# Patient Record
Sex: Female | Born: 1955 | Race: White | Hispanic: No | Marital: Married
Health system: Southern US, Community
[De-identification: ages and names within clinical notes are randomized; demographics above are authoritative.]

## PROBLEM LIST (undated history)

## (undated) DIAGNOSIS — M130 Polyarthritis, unspecified: Secondary | ICD-10-CM

---

## 2015-10-14 ENCOUNTER — Other Ambulatory Visit: Payer: Self-pay | Admitting: Endocrinology

## 2015-10-14 DIAGNOSIS — E041 Nontoxic single thyroid nodule: Secondary | ICD-10-CM

## 2015-10-27 ENCOUNTER — Ambulatory Visit
Admission: RE | Admit: 2015-10-27 | Discharge: 2015-10-27 | Disposition: A | Discharge status: Home / Self Care | Payer: BC Managed Care – PPO | Source: Ambulatory Visit | Attending: Endocrinology | Admitting: Endocrinology

## 2015-10-27 ENCOUNTER — Other Ambulatory Visit (HOSPITAL_COMMUNITY)
Admission: RE | Admit: 2015-10-27 | Discharge: 2015-10-27 | Disposition: A | Discharge status: Home / Self Care | Payer: BC Managed Care – PPO | Source: Ambulatory Visit | Attending: Interventional Radiology | Admitting: Interventional Radiology

## 2015-10-27 DIAGNOSIS — E041 Nontoxic single thyroid nodule: Secondary | ICD-10-CM

## 2015-10-27 DIAGNOSIS — E042 Nontoxic multinodular goiter: Secondary | ICD-10-CM | POA: Insufficient documentation

## 2015-11-12 ENCOUNTER — Ambulatory Visit
Admission: RE | Admit: 2015-11-12 | Discharge: 2015-11-12 | Disposition: A | Discharge status: Home / Self Care | Payer: BC Managed Care – PPO | Source: Ambulatory Visit | Attending: Interventional Radiology | Admitting: Interventional Radiology

## 2015-11-12 ENCOUNTER — Other Ambulatory Visit: Payer: Self-pay | Admitting: Endocrinology

## 2015-11-12 ENCOUNTER — Other Ambulatory Visit (HOSPITAL_COMMUNITY): Payer: Self-pay | Admitting: Interventional Radiology

## 2015-11-12 ENCOUNTER — Ambulatory Visit
Admission: RE | Admit: 2015-11-12 | Discharge: 2015-11-12 | Disposition: A | Discharge status: Home / Self Care | Payer: BC Managed Care – PPO | Source: Ambulatory Visit | Attending: Endocrinology | Admitting: Endocrinology

## 2015-11-12 DIAGNOSIS — R221 Localized swelling, mass and lump, neck: Secondary | ICD-10-CM

## 2015-11-12 HISTORY — PX: IR GENERIC HISTORICAL: IMG1180011

## 2015-11-12 NOTE — Progress Notes (Signed)
Patient ID: Tami Barnes, female   DOB: 24-May-1956, 60 y.o.   MRN: [id number]    Chief Complaint: Patient was seen in consultation today for  Chief Complaint  Patient presents with  . Follow-up    2.5 wk follow up US Thyroid Biopsy   at the request of Tami Barnes  Referring Physician(s): Tami Barnes  Supervising Physician: Tami Barnes  History of Present Illness: Tami Barnes is a 60 y.o. female who had a left thyroid biopsy 2 weeks ago and recently experienced swelling in the left side of her neck. She returns for evaluation of the swelling. She denies any difficulty breathing but does have slight pressure within her neck. She denies difficulty swallowing. The biopsy results were benign.  No past medical history on file.  No past surgical history on file.  Allergies: Review of patient's allergies indicates no known allergies.  Medications: Prior to Admission medications   Not on File     No family history on file.  Social History   Social History  . Marital Status: Married    Spouse Name: N/A  . Number of Children: N/A  . Years of Education: N/A   Social History Main Topics  . Smoking status: Not on file  . Smokeless tobacco: Not on file  . Alcohol Use: Not on file  . Drug Use: Not on file  . Sexual Activity: Not on file   Other Topics Concern  . Not on file   Social History Narrative  . No narrative on file     Review of Systems: A 12 point ROS discussed and pertinent positives are indicated in the HPI above.  All other systems are negative.  Review of Systems  Vital Signs: BP 128/78 mmHg  Pulse 85  Temp(Src) 98.2 F (36.8 C) (Oral)  Resp 14  SpO2 97%  Physical Exam  Constitutional: She is oriented to person, place, and time. She appears well-developed and well-nourished.  Neck:  Examination of the neck demonstrates a mass in the region of the left thyroid lobe. There is no swelling within the soft tissues surrounding the left thyroid gland. No  obvious bruising or hematoma. Auscultation of the neck demonstrates no evidence of inspiratory stridor.  Neurological: She is alert and oriented to person, place, and time.  Skin: Skin is warm and dry.    Mallampati Score:     Imaging: US Thyroid Biopsy  10/27/2015  INDICATION: 60 year old female who has a history of 2 left-sided thyroid nodules. She has been referred for biopsy. EXAM: ULTRASOUND GUIDED NEEDLE ASPIRATE BIOPSY OF THE THYROID GLAND COMPARISON:  09/11/2015 MEDICATIONS: 1% lidocaine COMPLICATIONS: None PROCEDURE: Informed written consent was obtained from the patient after a thorough discussion of the procedural risks, benefits and alternatives. All questions were addressed. Maximal Sterile Barrier Technique was utilized including caps, mask, sterile gowns, sterile gloves, sterile drape, hand hygiene and skin antiseptic. A timeout was performed prior to the initiation of the procedure. The procedure, risks, benefits, and alternatives were explained to the patient. Questions regarding the procedure were encouraged and answered. The patient understands and consents to the procedure. Ultrasound survey was performed with images stored and sent to PACs. The left neck was prepped with Betadine in a sterile fashion, and a sterile drape was applied covering the operative field. A sterile gown and sterile gloves were used for the procedure. Local anesthesia was provided with 1% Lidocaine. Left inferior: Ultrasound guidance was used to infiltrate the region with 1% lidocaine for local anesthesia. Three separate 25  gauge fine needle biopsy were then acquired of the left inferior nodule using ultrasound guidance. Images were stored. Left mid: Three separate 25 gauge fine needle biopsy were then acquired of the left mid nodule using ultrasound guidance. Images were stored. Finally, a 20 gauge needle was used to aspirate approximately 5 cc of fluid from this partially cystic nodule. Slide preparation was  performed. Final image was stored after biopsy. Patient tolerated the procedure well and remained hemodynamically stable throughout. No complications were encountered and no significant blood loss was encounter IMPRESSION: Status post ultrasound-guided biopsy of left mid thyroid nodule and left inferior thyroid nodule. Tissue specimen sent to pathology for complete histopathologic analysis. Signed, Tami Neu. Loreta Ave, DO [facility] Radiology Electronically Signed   By: Gilmer Mor D.O.   On: 10/27/2015 17:20   US Thyroid Biopsy  10/27/2015  INDICATION: 60 year old female who has a history of 2 left-sided thyroid nodules. She has been referred for biopsy. EXAM: ULTRASOUND GUIDED NEEDLE ASPIRATE BIOPSY OF THE THYROID GLAND COMPARISON:  09/11/2015 MEDICATIONS: 1% lidocaine COMPLICATIONS: None PROCEDURE: Informed written consent was obtained from the patient after a thorough discussion of the procedural risks, benefits and alternatives. All questions were addressed. Maximal Sterile Barrier Technique was utilized including caps, mask, sterile gowns, sterile gloves, sterile drape, hand hygiene and skin antiseptic. A timeout was performed prior to the initiation of the procedure. The procedure, risks, benefits, and alternatives were explained to the patient. Questions regarding the procedure were encouraged and answered. The patient understands and consents to the procedure. Ultrasound survey was performed with images stored and sent to PACs. The left neck was prepped with Betadine in a sterile fashion, and a sterile drape was applied covering the operative field. A sterile gown and sterile gloves were used for the procedure. Local anesthesia was provided with 1% Lidocaine. Left inferior: Ultrasound guidance was used to infiltrate the region with 1% lidocaine for local anesthesia. Three separate 25 gauge fine needle biopsy were then acquired of the left inferior nodule  using ultrasound guidance. Images were stored. Left mid: Three separate 25 gauge fine needle biopsy were then acquired of the left mid nodule using ultrasound guidance. Images were stored. Finally, a 20 gauge needle was used to aspirate approximately 5 cc of fluid from this partially cystic nodule. Slide preparation was performed. Final image was stored after biopsy. Patient tolerated the procedure well and remained hemodynamically stable throughout. No complications were encountered and no significant blood loss was encounter IMPRESSION: Status post ultrasound-guided biopsy of left mid thyroid nodule and left inferior thyroid nodule. Tissue specimen sent to pathology for complete histopathologic analysis. Signed, Tami Neu. Loreta Ave, DO [facility] Radiology Electronically Signed   By: Gilmer Mor D.O.   On: 10/27/2015 17:20    Labs:  CBC: No results for input(s): WBC, HGB, HCT, PLT in the last 8760 hours.  COAGS: No results for input(s): INR, APTT in the last 8760 hours.  BMP: No results for input(s): NA, K, CL, CO2, GLUCOSE, BUN, CALCIUM, CREATININE, GFRNONAA, GFRAA in the last 8760 hours.  Invalid input(s): CMP  LIVER FUNCTION TESTS: No results for input(s): BILITOT, AST, ALT, ALKPHOS, PROT, ALBUMIN in the last 8760 hours.  TUMOR MARKERS: No results for input(s): AFPTM, CEA, CA199, CHROMGRNA in the last 8760 hours.  Assessment and Plan:  Tami Barnes did experience some neck swelling after her left thyroid biopsy. There is no evidence of soft tissue hematoma within the neck. The  dominant nodule in the left lobe has slightly enlarged from 25 mm to 31 mm likely due to a small amount of internal hemorrhage. She was instructed to apply ice 3 times daily and hopefully her symptoms will improve. If her symptoms do not improve, surgical consultation can be performed and was explained to the patient.  Thank you for this interesting consult.  I  greatly enjoyed meeting Tami Barnes and look forward to participating in their care.  A copy of this report was sent to the requesting provider on this date.  Electronically Signed: Feven Alderfer, ART A 11/12/2015, 3:39 PM   I spent a total of   10 Minutes in face to face in clinical consultation, greater than 50% of which was counseling/coordinating care for thyroid biopsy and neck swelling.

## 2016-08-19 ENCOUNTER — Encounter: Payer: Self-pay | Admitting: Interventional Radiology

## 2017-05-29 IMAGING — US US THYROID BIOPSY
1 series · 13 of 20 positions shown · non-contrast
Comparison: 09/11/2015

INDICATION: 59-year-old female who has a history of 2 left-sided thyroid
nodules. She has been referred for biopsy.

EXAM:
ULTRASOUND GUIDED NEEDLE ASPIRATE BIOPSY OF THE THYROID GLAND

[Series 1: us thyroid biopsy · 0.08mm/px · 20 acquisitions, 13 frames shown]
[im 1/20]
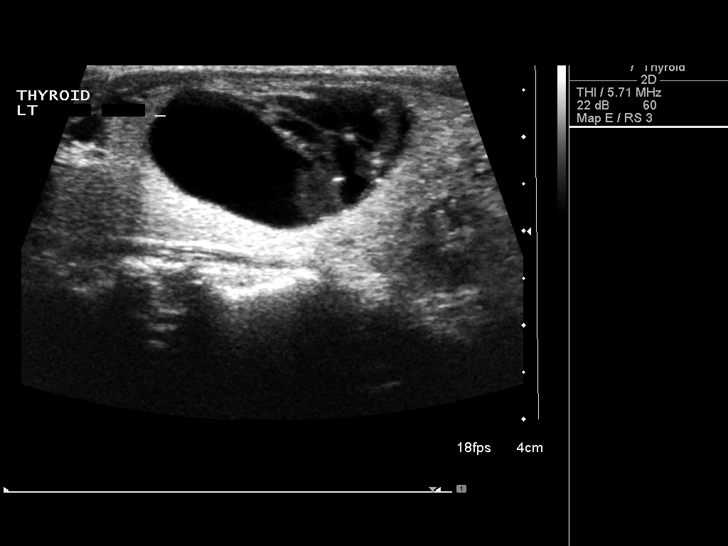
[im 3/20]
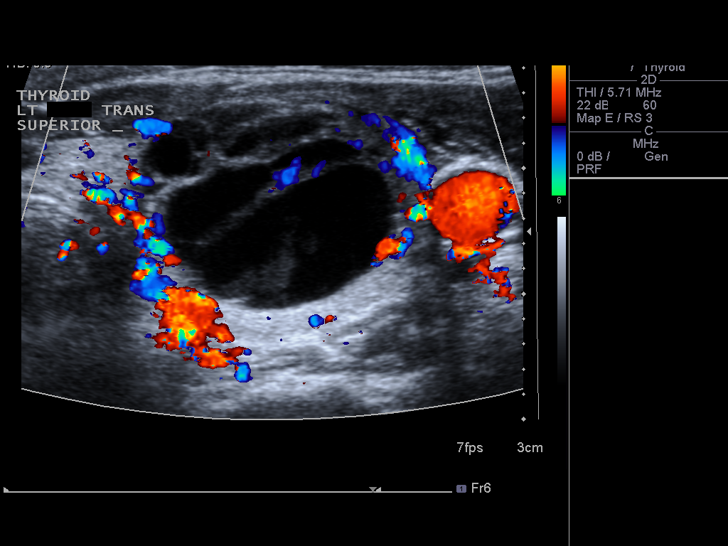
[im 4/20]
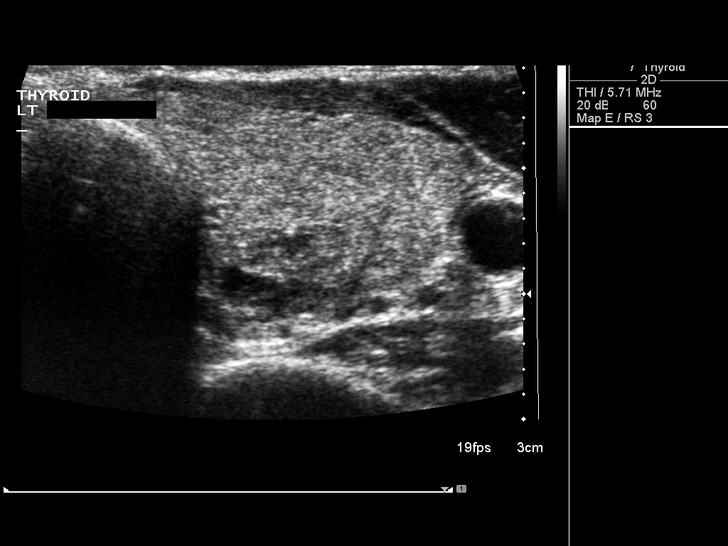
[im 6/20]
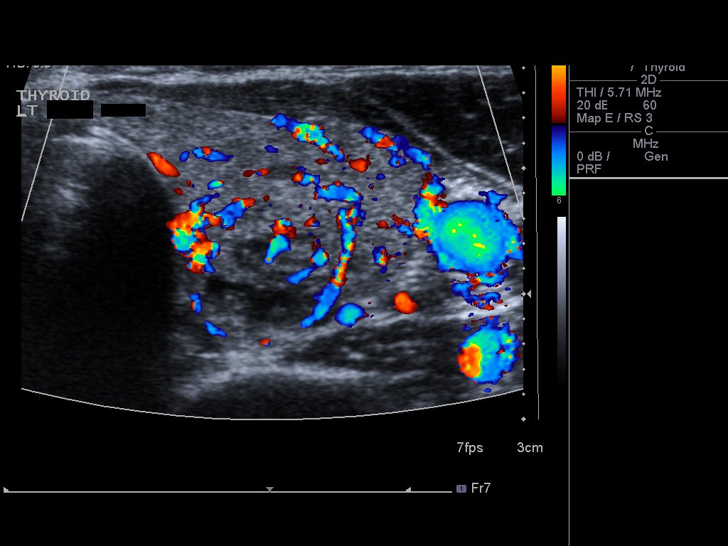
[im 7/20]
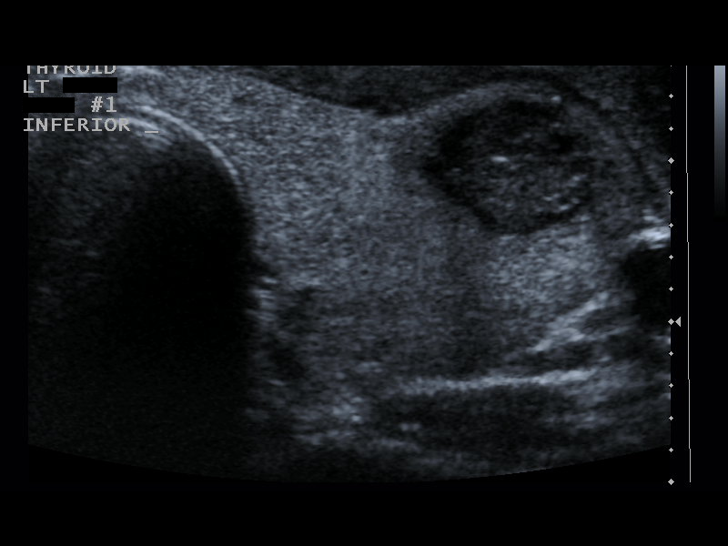
[im 9/20]
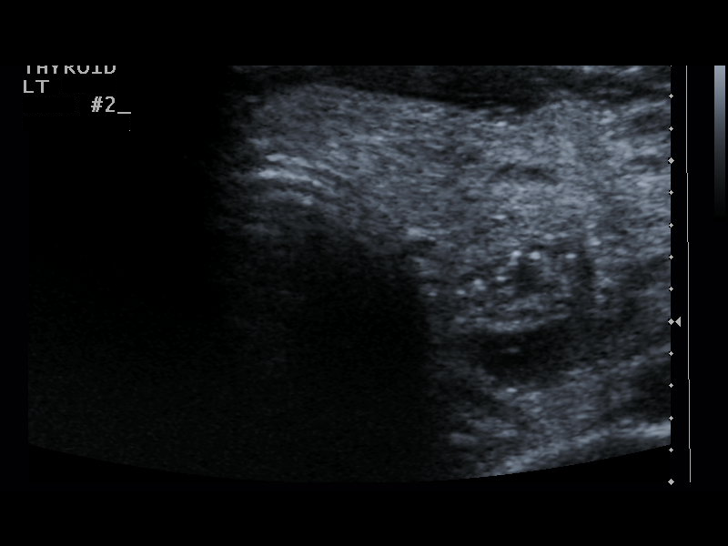
[im 11/20]
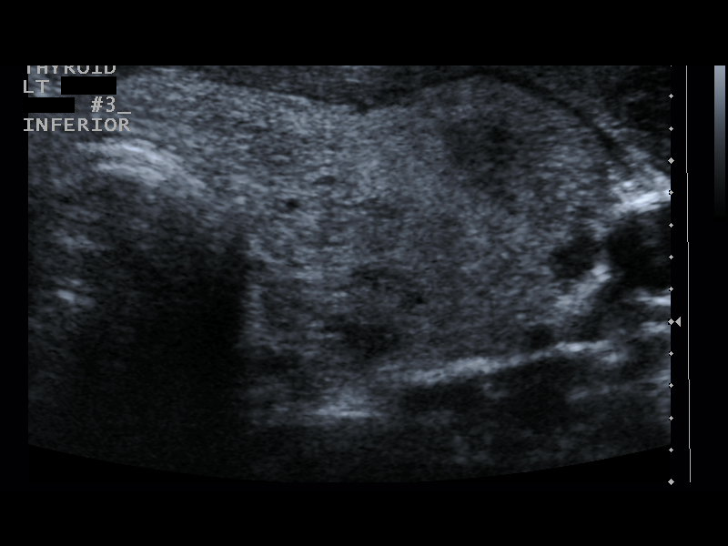
[im 12/20]
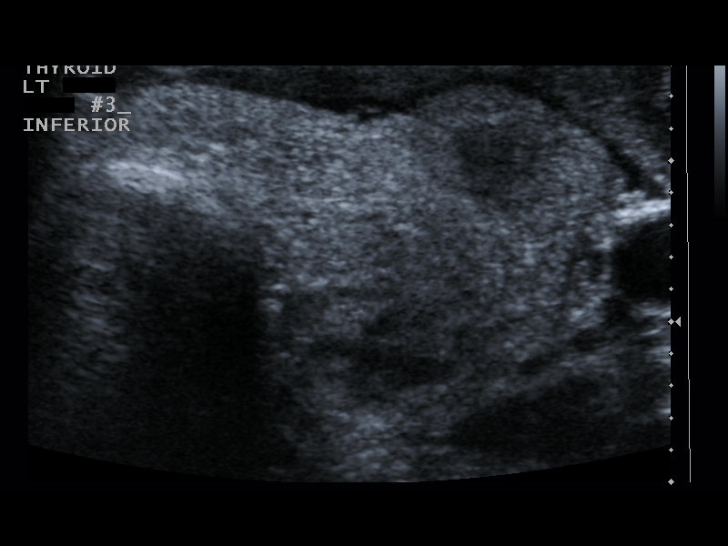
[im 14/20]
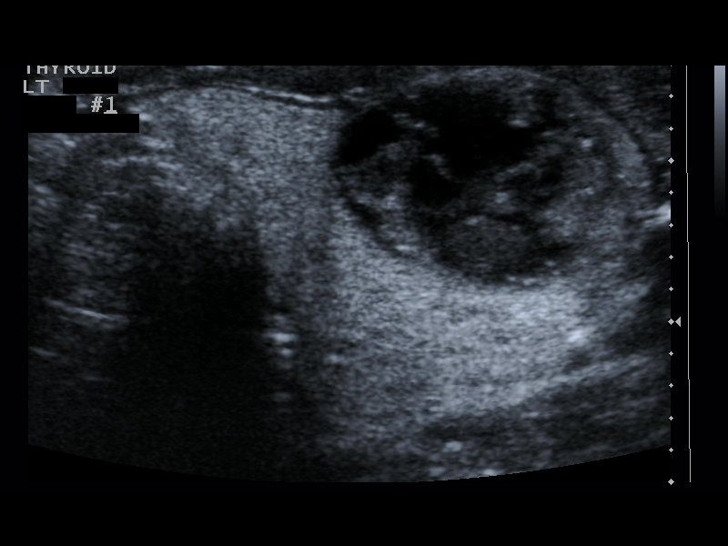
[im 15/20]
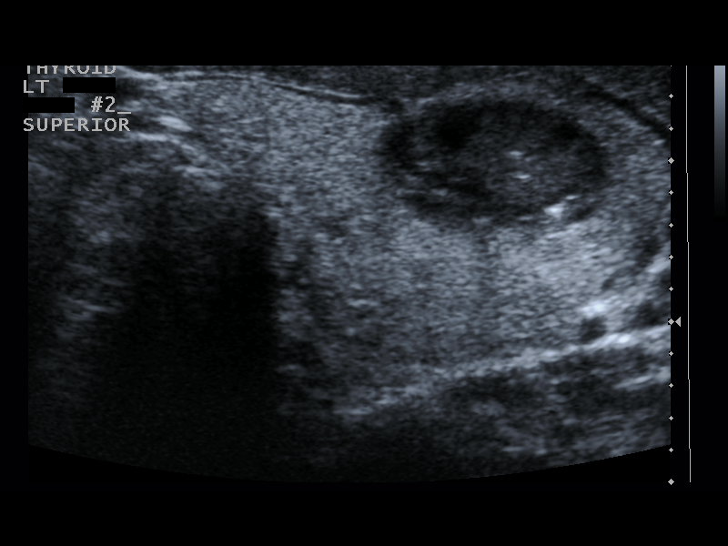
[im 17/20]
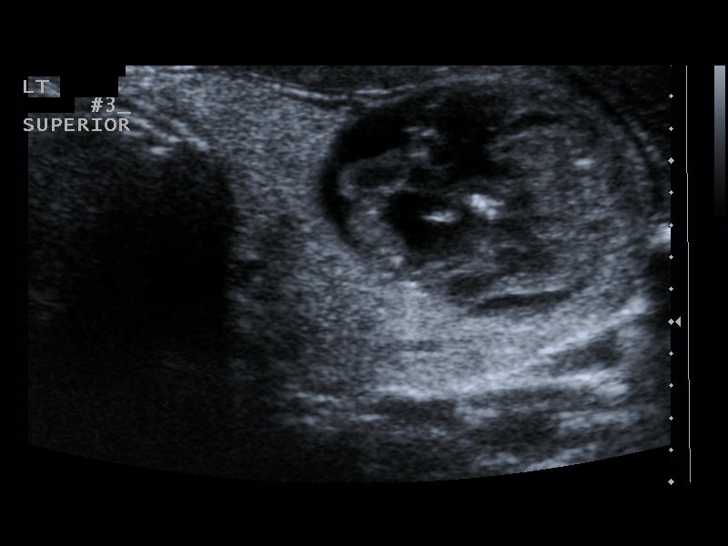
[im 18/20]
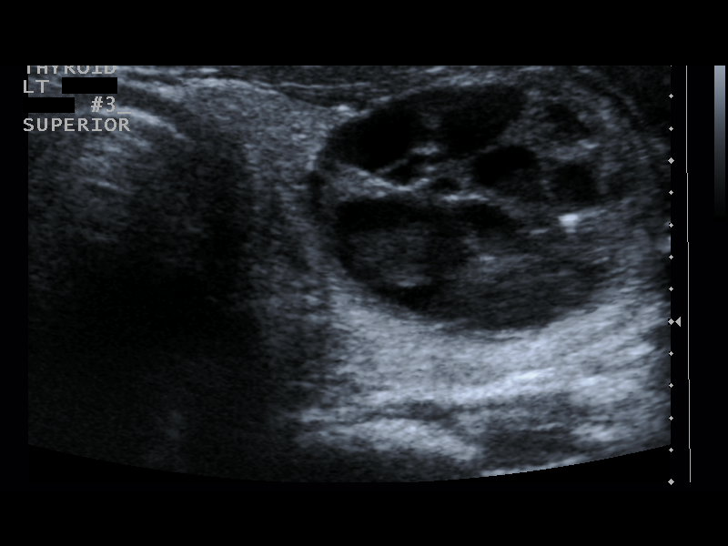
[im 20/20]
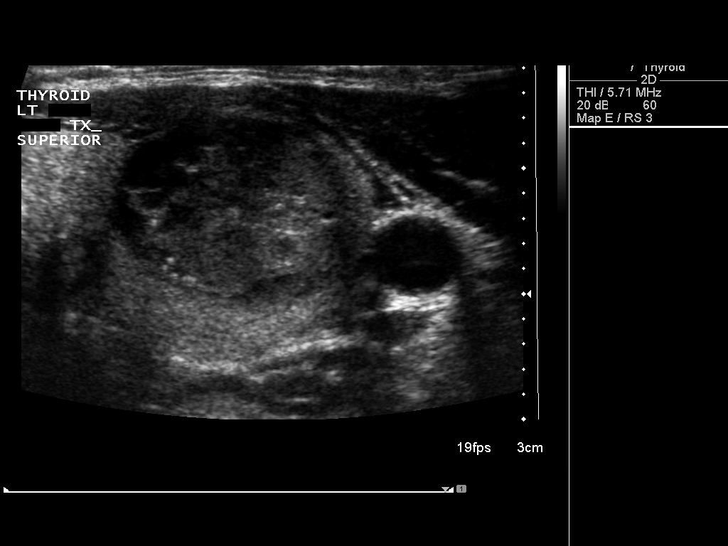

[13 of 20 positions shown; findings below may reference images not displayed]

MEDICATIONS:
1% lidocaine

COMPLICATIONS:
None

PROCEDURE:
Informed written consent was obtained from the patient after a
thorough discussion of the procedural risks, benefits and
alternatives. All questions were addressed. Maximal Sterile Barrier
Technique was utilized including caps, mask, sterile gowns, sterile
gloves, sterile drape, hand hygiene and skin antiseptic. A timeout
was performed prior to the initiation of the procedure.

The procedure, risks, benefits, and alternatives were explained to
the patient. Questions regarding the procedure were encouraged and
answered. The patient understands and consents to the procedure.

Ultrasound survey was performed with images stored and sent to PACs.

The left neck was prepped with Betadine in a sterile fashion, and a
sterile drape was applied covering the operative field. A sterile
gown and sterile gloves were used for the procedure. Local
anesthesia was provided with 1% Lidocaine.

Left inferior:

Ultrasound guidance was used to infiltrate the region with 1%
lidocaine for local anesthesia. Three separate 25 gauge fine needle
biopsy were then acquired of the left inferior nodule using
ultrasound guidance. Images were stored.

Left mid:

Three separate 25 gauge fine needle biopsy were then acquired of the
left mid nodule using ultrasound guidance. Images were stored.
Finally, a 20 gauge needle was used to aspirate approximately 5 cc
of fluid from this partially cystic nodule.

Slide preparation was performed.

Final image was stored after biopsy.

Patient tolerated the procedure well and remained hemodynamically
stable throughout.

No complications were encountered and no significant blood loss was
encounter
IMPRESSION: Status post ultrasound-guided biopsy of left mid thyroid nodule and
left inferior thyroid nodule. Tissue specimen sent to pathology for
complete histopathologic analysis.

## 2017-06-14 IMAGING — US US SOFT TISSUE HEAD/NECK
1 series · 14 of 14 positions shown · non-contrast
Comparison: None.

CLINICAL DATA: Neck swelling after left thyroid biopsy

EXAM:
ULTRASOUND OF HEAD/NECK SOFT TISSUES
TECHNIQUE: Ultrasound examination of the head and neck soft tissues was
performed in the area of clinical concern.

[Series 1: us soft tissue head/neck · 0.06mm/px · 14 of 14 slices shown]
[im 1/14]
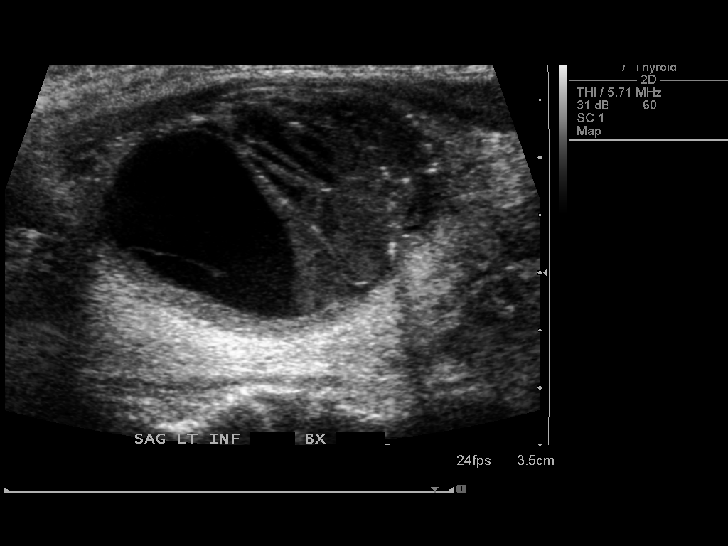
[im 2/14]
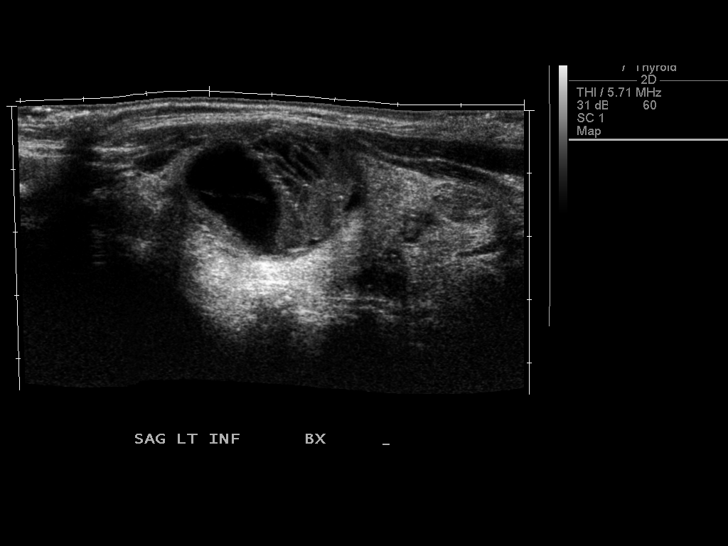
[im 3/14]
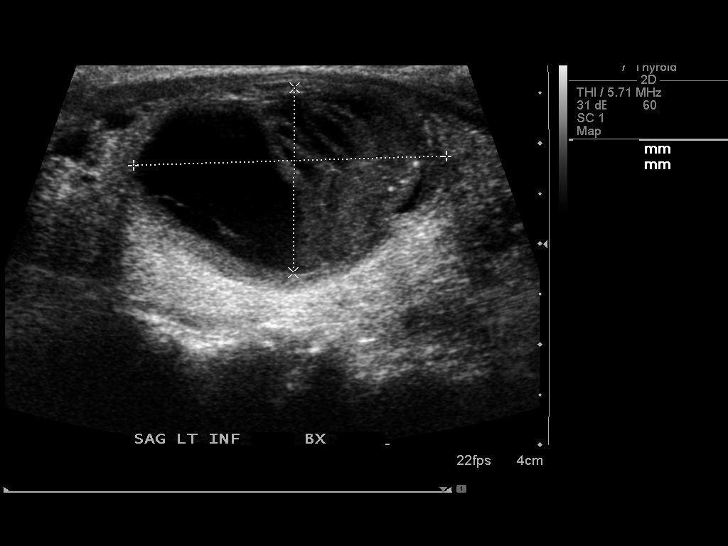
[im 4/14]
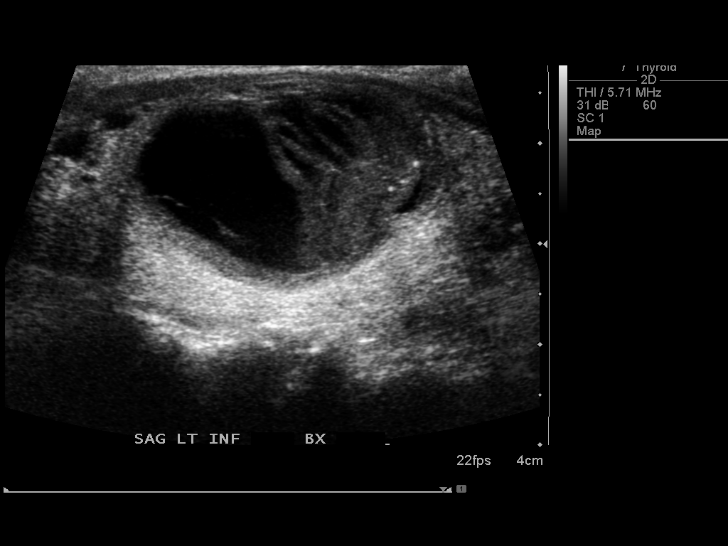
[im 5/14]
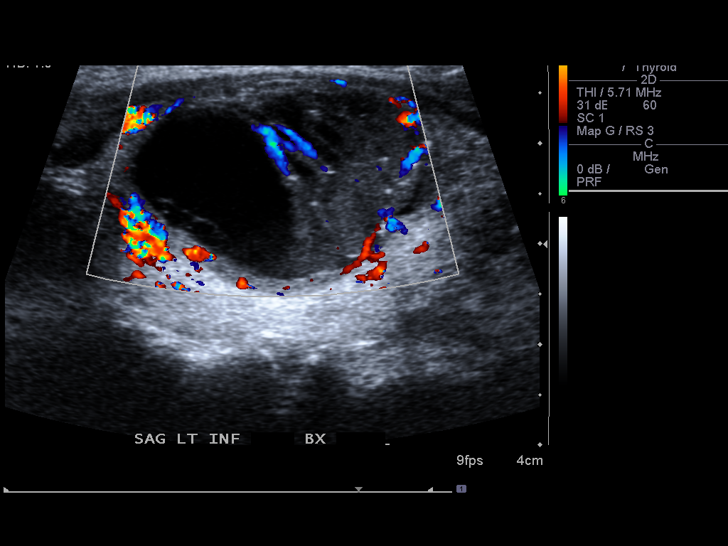
[im 6/14]
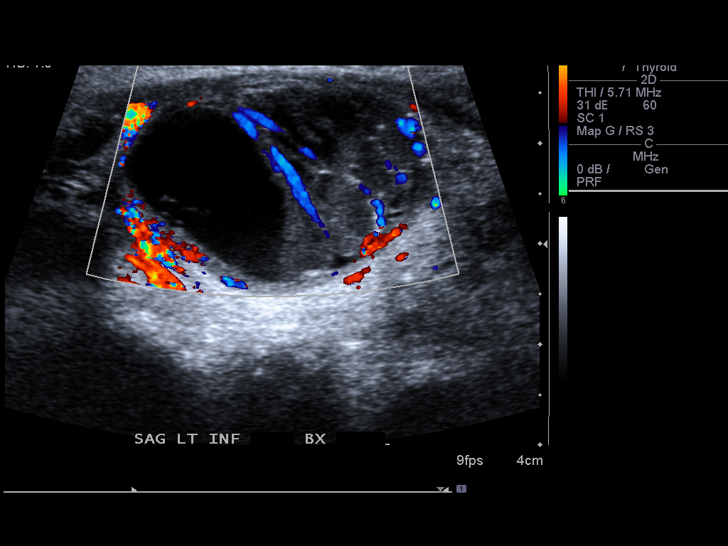
[im 7/14]
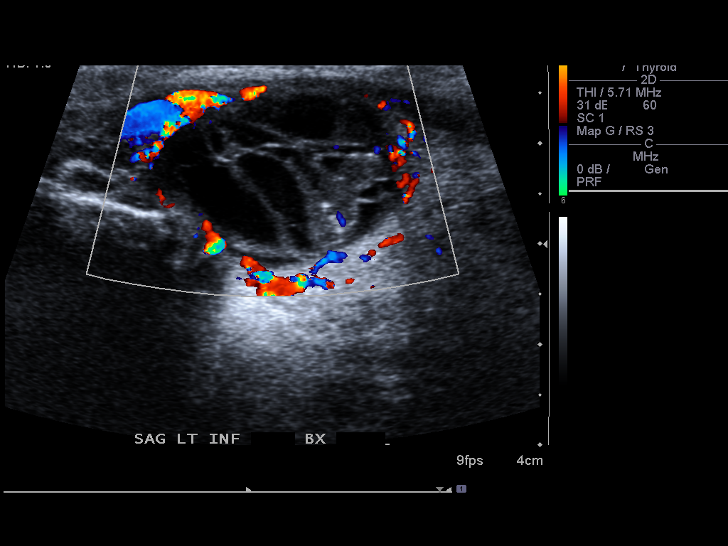
[im 8/14]
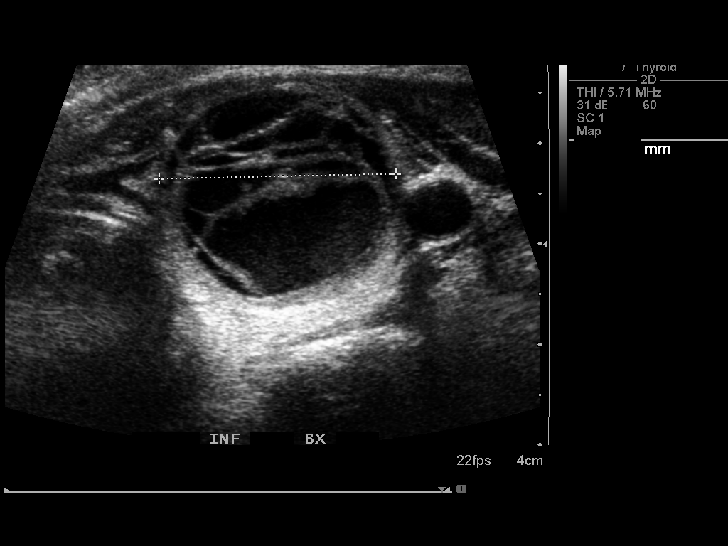
[im 9/14]
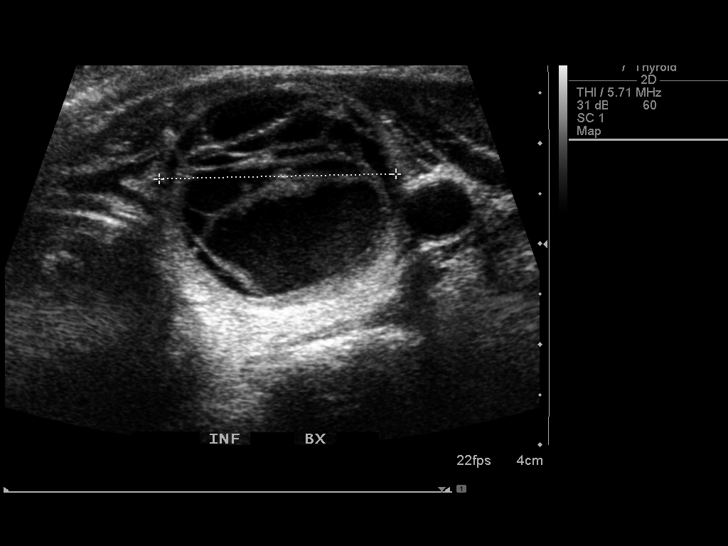
[im 10/14]
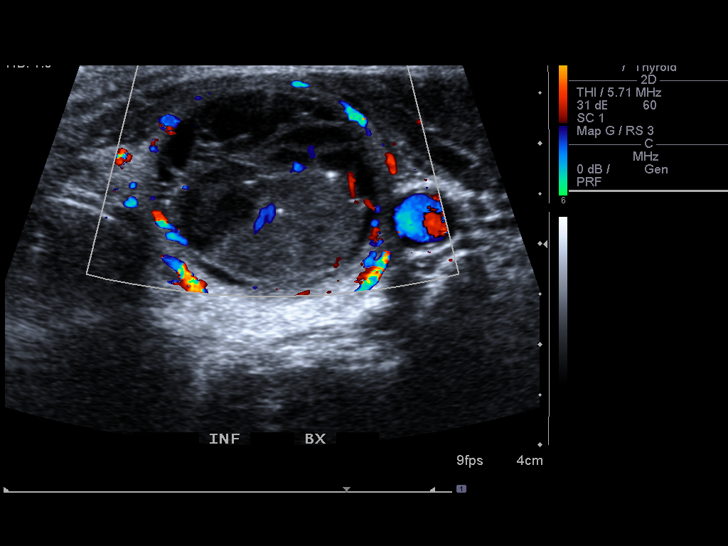
[im 11/14]
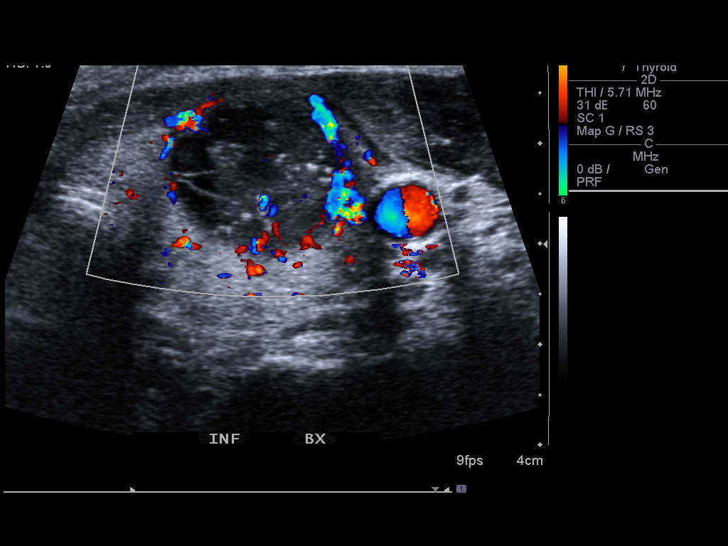
[im 12/14]
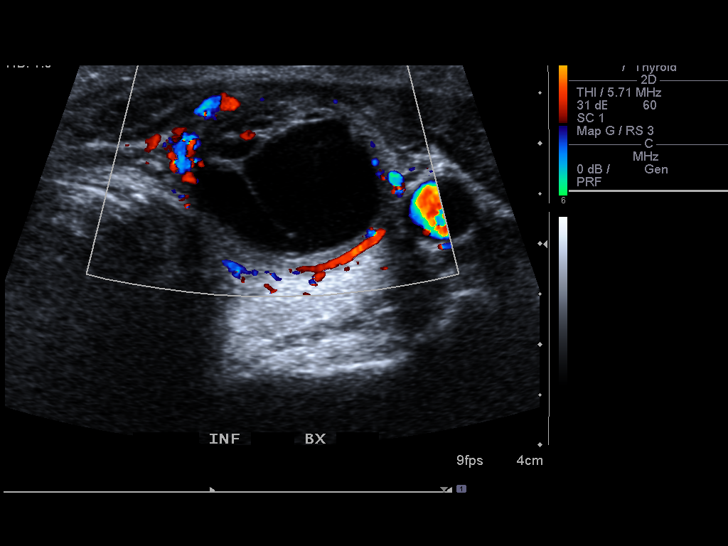
[im 13/14]
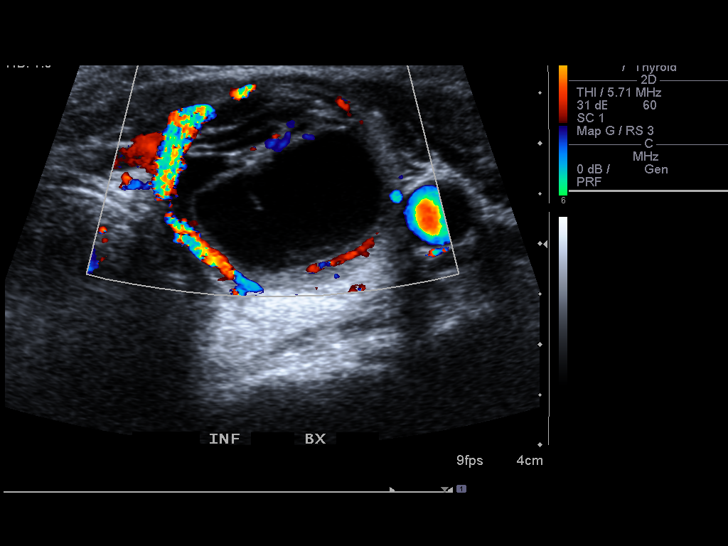
[im 14/14]
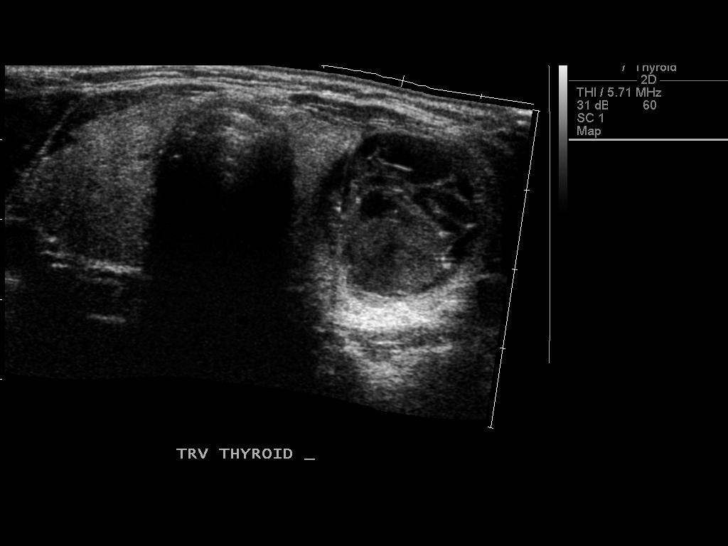

[14 of 14 positions shown; findings below may reference images not displayed]

FINDINGS: Limited evaluation of the thyroid gland and left side of the neck
demonstrates no soft tissue hematoma for expansion of the underlying
musculature. The complex left thyroid nodule previously measured up
to 2.5 cm by today measures up to 3.1 cm. Therefore, there has been
some enlargement of this nodule likely secondary to a small amount
of internal hemorrhage.
IMPRESSION: There is no evidence of soft tissue hemorrhage within the neck soft
tissues. The nodule in the left lobe which was previously biopsy has
slightly enlarged likely due to a small amount of internal
hemorrhage.

## 2019-01-14 ENCOUNTER — Encounter (HOSPITAL_COMMUNITY): Payer: Self-pay | Admitting: *Deleted

## 2019-01-14 ENCOUNTER — Other Ambulatory Visit: Payer: Self-pay

## 2019-01-14 ENCOUNTER — Observation Stay (HOSPITAL_COMMUNITY): Payer: BC Managed Care – PPO

## 2019-01-14 ENCOUNTER — Observation Stay (HOSPITAL_COMMUNITY)
Admission: EM | Admit: 2019-01-14 | Discharge: 2019-01-15 | Disposition: A | Discharge status: Home / Self Care | Payer: BC Managed Care – PPO | Attending: Family Medicine | Admitting: Family Medicine

## 2019-01-14 DIAGNOSIS — R05 Cough: Secondary | ICD-10-CM | POA: Diagnosis not present

## 2019-01-14 DIAGNOSIS — G43909 Migraine, unspecified, not intractable, without status migrainosus: Secondary | ICD-10-CM | POA: Insufficient documentation

## 2019-01-14 DIAGNOSIS — M069 Rheumatoid arthritis, unspecified: Secondary | ICD-10-CM | POA: Diagnosis not present

## 2019-01-14 DIAGNOSIS — Z79899 Other long term (current) drug therapy: Secondary | ICD-10-CM | POA: Insufficient documentation

## 2019-01-14 DIAGNOSIS — Z1159 Encounter for screening for other viral diseases: Secondary | ICD-10-CM | POA: Diagnosis not present

## 2019-01-14 DIAGNOSIS — J158 Pneumonia due to other specified bacteria: Secondary | ICD-10-CM | POA: Insufficient documentation

## 2019-01-14 DIAGNOSIS — A310 Pulmonary mycobacterial infection: Principal | ICD-10-CM

## 2019-01-14 DIAGNOSIS — M2550 Pain in unspecified joint: Secondary | ICD-10-CM

## 2019-01-14 DIAGNOSIS — Z87891 Personal history of nicotine dependence: Secondary | ICD-10-CM | POA: Diagnosis not present

## 2019-01-14 DIAGNOSIS — K219 Gastro-esophageal reflux disease without esophagitis: Secondary | ICD-10-CM

## 2019-01-14 DIAGNOSIS — R059 Cough, unspecified: Secondary | ICD-10-CM

## 2019-01-14 DIAGNOSIS — I10 Essential (primary) hypertension: Secondary | ICD-10-CM

## 2019-01-14 HISTORY — DX: Polyarthritis, unspecified: M13.0

## 2019-01-14 LAB — COMPREHENSIVE METABOLIC PANEL
ALT: 12 U/L (ref 0–44)
AST: 12 U/L — ABNORMAL LOW (ref 15–41)
Albumin: 2.7 g/dL — ABNORMAL LOW (ref 3.5–5.0)
Alkaline Phosphatase: 143 U/L — ABNORMAL HIGH (ref 38–126)
Anion gap: 10 (ref 5–15)
BUN: 11 mg/dL (ref 8–23)
CO2: 25 mmol/L (ref 22–32)
Calcium: 9.1 mg/dL (ref 8.9–10.3)
Chloride: 101 mmol/L (ref 98–111)
Creatinine, Ser: 0.46 mg/dL (ref 0.44–1.00)
GFR calc Af Amer: >60 mL/min (ref >60)
GFR calc non Af Amer: >60 mL/min (ref >60)
Glucose, Bld: 103 mg/dL — ABNORMAL HIGH (ref 70–99)
Potassium: 3.9 mmol/L (ref 3.5–5.1)
Sodium: 136 mmol/L (ref 135–145)
Total Bilirubin: 0.8 mg/dL (ref 0.3–1.2)
Total Protein: 6.7 g/dL (ref 6.5–8.1)

## 2019-01-14 LAB — SARS CORONAVIRUS 2 BY RT PCR (HOSPITAL ORDER, PERFORMED IN ~~LOC~~ HOSPITAL LAB): SARS Coronavirus 2: NEGATIVE

## 2019-01-14 LAB — CBC WITH DIFFERENTIAL/PLATELET
Abs Immature Granulocytes: 0.04 10*3/uL (ref 0.00–0.07)
Basophils Absolute: 0 10*3/uL (ref 0.0–0.1)
Basophils Relative: 0 %
Eosinophils Absolute: 0.1 10*3/uL (ref 0.0–0.5)
Eosinophils Relative: 1 %
HCT: 34.5 % — ABNORMAL LOW (ref 36.0–46.0)
Hemoglobin: 10.7 g/dL — ABNORMAL LOW (ref 12.0–15.0)
Immature Granulocytes: 0 %
Lymphocytes Relative: 9 %
Lymphs Abs: 0.9 10*3/uL (ref 0.7–4.0)
MCH: 28.8 pg (ref 26.0–34.0)
MCHC: 31 g/dL (ref 30.0–36.0)
MCV: 92.7 fL (ref 80.0–100.0)
Monocytes Absolute: 0.7 10*3/uL (ref 0.1–1.0)
Monocytes Relative: 8 %
Neutro Abs: 7.7 10*3/uL (ref 1.7–7.7)
Neutrophils Relative %: 82 %
Platelets: 423 10*3/uL — ABNORMAL HIGH (ref 150–400)
RBC: 3.72 MIL/uL — ABNORMAL LOW (ref 3.87–5.11)
RDW: 12.3 % (ref 11.5–15.5)
WBC: 9.5 10*3/uL (ref 4.0–10.5)
nRBC: 0 % (ref 0.0–0.2)

## 2019-01-14 MED ORDER — PANTOPRAZOLE SODIUM 40 MG PO TBEC
40.0000 mg | DELAYED_RELEASE_TABLET | Freq: Every day | ORAL | Status: DC
  Administered 2019-01-15: 40 mg via ORAL
  Filled 2019-01-14 (×2): qty 1

## 2019-01-14 MED ORDER — ETHAMBUTOL HCL 400 MG PO TABS
400.0000 mg | ORAL_TABLET | Freq: Every day | ORAL | Status: DC
Start: 2019-01-14 — End: 2019-01-15
  Administered 2019-01-14 – 2019-01-15 (×2): 400 mg via ORAL
  Filled 2019-01-14 (×2): qty 1

## 2019-01-14 MED ORDER — KETOROLAC TROMETHAMINE 15 MG/ML IJ SOLN
15.0000 mg | Freq: Once | INTRAMUSCULAR | Status: AC
  Administered 2019-01-14: 15 mg via INTRAVENOUS
  Filled 2019-01-14: qty 1

## 2019-01-14 MED ORDER — ACETAMINOPHEN 325 MG PO TABS: 650.0000 mg | ORAL_TABLET | Freq: Four times a day (QID) | ORAL | Status: DC | PRN

## 2019-01-14 MED ORDER — ACETAMINOPHEN 650 MG RE SUPP: 650.0000 mg | Freq: Four times a day (QID) | RECTAL | Status: DC | PRN

## 2019-01-14 MED ORDER — METOPROLOL SUCCINATE ER 50 MG PO TB24
50.0000 mg | ORAL_TABLET | Freq: Every day | ORAL | Status: DC
  Administered 2019-01-15: 50 mg via ORAL
  Filled 2019-01-14 (×2): qty 1

## 2019-01-14 MED ORDER — VITAMIN D 25 MCG (1000 UNIT) PO TABS
1000.0000 [IU] | ORAL_TABLET | Freq: Every day | ORAL | Status: DC
  Administered 2019-01-15: 1000 [IU] via ORAL
  Filled 2019-01-14: qty 1

## 2019-01-14 MED ORDER — AZITHROMYCIN 500 MG PO TABS
500.0000 mg | ORAL_TABLET | Freq: Every day | ORAL | Status: DC
  Administered 2019-01-14 – 2019-01-15 (×2): 500 mg via ORAL
  Filled 2019-01-14 (×2): qty 1

## 2019-01-14 MED ORDER — KETOROLAC TROMETHAMINE 10 MG PO TABS
10.0000 mg | ORAL_TABLET | Freq: Four times a day (QID) | ORAL | Status: DC | PRN
  Administered 2019-01-14 – 2019-01-15 (×2): 10 mg via ORAL
  Filled 2019-01-14 (×3): qty 1

## 2019-01-14 MED ORDER — RIFAMPIN 300 MG PO CAPS
300.0000 mg | ORAL_CAPSULE | Freq: Two times a day (BID) | ORAL | Status: DC
  Administered 2019-01-14 – 2019-01-15 (×2): 300 mg via ORAL
  Filled 2019-01-14 (×3): qty 1

## 2019-01-14 MED ORDER — ENOXAPARIN SODIUM 40 MG/0.4ML ~~LOC~~ SOLN
40.0000 mg | SUBCUTANEOUS | Status: DC
  Administered 2019-01-14: 40 mg via SUBCUTANEOUS
  Filled 2019-01-14: qty 0.4

## 2019-01-14 MED ORDER — KETOROLAC TROMETHAMINE 15 MG/ML IJ SOLN: 15.0000 mg | Freq: Four times a day (QID) | INTRAMUSCULAR | Status: DC | PRN

## 2019-01-14 MED ORDER — RAMELTEON 8 MG PO TABS
8.0000 mg | ORAL_TABLET | Freq: Every day | ORAL | Status: DC
  Administered 2019-01-14: 8 mg via ORAL
  Filled 2019-01-14 (×2): qty 1

## 2019-01-14 MED ORDER — RISAQUAD PO CAPS
ORAL_CAPSULE | Freq: Every day | ORAL | Status: DC
  Administered 2019-01-15: 1 via ORAL
  Filled 2019-01-14 (×2): qty 1

## 2019-01-14 MED ORDER — ONDANSETRON HCL 4 MG PO TABS
4.0000 mg | ORAL_TABLET | Freq: Four times a day (QID) | ORAL | Status: DC | PRN
  Administered 2019-01-14 – 2019-01-15 (×3): 4 mg via ORAL
  Filled 2019-01-14 (×3): qty 1

## 2019-01-14 MED ORDER — DICLOFENAC SODIUM 1 % TD GEL
2.0000 g | Freq: Four times a day (QID) | TRANSDERMAL | Status: DC
  Administered 2019-01-14 – 2019-01-15 (×3): 2 g via TOPICAL
  Filled 2019-01-14: qty 100

## 2019-01-14 NOTE — ED Notes (Signed)
ED Provider at bedside. 

## 2019-01-14 NOTE — ED Notes (Signed)
Pt. Is communicating with friends and family with her cell phone.

## 2019-01-14 NOTE — ED Triage Notes (Signed)
Pt in c/o continued joint pain and swelling, was admitted in [facility] for several weeks, was recommended that pt come to [facility] to see infectious disease and receive a PICC for IV antibiotics, no distress noted

## 2019-01-14 NOTE — H&P (Addendum)
[facility] Admission History and Physical Service Pager: [phone]  Patient name: Tami Barnes Medical record number: [id number] Date of birth: [birth year] Age: 63 y.o. Gender: female  Primary Care Provider: Loraine Leriche., MD Consultants: ID Code Status: Full Preferred Emergency Contact: Son, Tami Barnes, [place]  Chief Complaint: Known MAC pneumonia  Assessment and Plan: Tami Barnes is a 63 y.o. female presenting with polyarticular joint pain and MAC pneumonia. PMH is significant for known MAC pneumonia, HTN, joint pain with positive RF and ANCA, migraines, GERD.  MAC Pneumonia Followed as outpatient by Dr. Sheral Flow. Patient had hospitalization in May at [facility] for pneumonia, during which she received a bronchoscopy and was diagnosed with MAC.  At that time, ID had been involved and had recommended a PICC line, but patient had declined.  She was discharged from the hospital on p.o. antibiotics and has been taking azithromycin, ethambutol, rifampin.  She was discharged from [facility] regional 10 days ago.  Prior to having a PICC line inserted, patient had noted that she wanted to have a CVTS consult.  She has an appointment scheduled for 6/15, but has not yet been seen by them.  Per ID note in care everywhere, both patient's ID physician and pulmonologist do not believe that she would be a good candidate for surgery given her extensive infectious disease.  Patient has been having worsening polyarticular joint pain in the last week, and is now I am able to PICC line placement for antibiotics.  She presented to ED with request to be admitted to see ID at [facility] and have PICC line placed, as her joints have gotten to the point that she is having difficulty functioning at home.  Spoke with ID physician on-call, Dr. Linus Salmons, who notes that he would not like to initiate IV antibiotics at this time, as he is not sure that patient needs them.  He would  like to do his own work-up for this.  Will admit patient to facilitate work-up and consult as needed. -Admit to observation, Dr. Cindra Presume service -Vitals per floor protocol -Chest x-ray -ID consulted, appreciate recommendations, plan to see 6/2 -Continue PO azithromycin, ethambutol, rifampin pending ID recs -Spoke with infection prevention who agreed that patient does not need to be on precautions -Check HIV as not seen in care everywhere -Consider CVTS consult in a.m. pending ID recs -Regular diet -Lovenox for DVT Ppx  Joint Pain with positive RF and ANCA Patient has been having worsening polyarticular joint pain and swelling, mostly in hands, in the last 10 days.  She has had a positive rheumatoid factor and ANCA, and was referred to rheumatology by her ID physician.  Patient continues to state "I cannot take care of myself like this."  She has been taking Tylenol 1000 mg and ibuprofen 600 mg 3 times daily for pain without improvement.  On exam, patient does have swelling noted of fingers, no tenderness to palpation.  She also states "some of the doctors that I am seeing think that this has something to do with my lung infection."  Would not start prednisone given patient's active infection with MAC. - toradol 23m q6h prn - tylenol [id number] prn - voltaren gel QID to affected areas - f/i ID recs - f/u rheum outpatient  HTN Patient has hypertension noted on past medical history on chart review.  No antihypertensives at present.  BP on admission 112/86. -Continue to monitor BP  Migraines Patient has a history of migraines and  takes Maxalt 10 mg as needed.  Denies any headache at present. -Can give Maxalt if needed for headache  GERD On Prilosec 40 mg daily at home. -Continue Protonix as is formulary  FEN/GI: Regular Prophylaxis: Lovenox  Disposition: admit to observation, med-surg  History of Present Illness:  Tami Barnes is a 63 y.o. female presenting with MAC pneumonia and  request for ID consult and PICC placement.    Patient discharged from [facility] 10 days ago during which hospitalization she was found to have MAC, confirmed by bronchoscopy.  ID physician had recommended she have PICC placement for Iv antibiotic treatment.  Patient had refused at that time and was placed on PO antibiotics.  She started to develop polyarthralgia at home and stated that she was "barely able to put my clothes on."  She continued to follow up with ID, Dr. Garnette Scheuermann, who performed RF and ANCA which were positive and referred patient to Rheumatology.  That patient stated that she does not have appointment scheduled until 6/15 and she "can't make it that long."  Tami Barnes also states, "Some doctors think my joint pain will get better with my lung infection."  She states that she is now agreeable to PICC and would like to be seen by ID here for that as she has a close friend who is a Marine scientist at [employer]. Does not feel comfortable with outpatient discharge as she states her joint pain makes it difficult to care for herself at home.    She continues to endorse cough, SOB, and joint pain, particularly in the hands.  She has been without fever and has been compliant with her PO antibiotics, but denies improvement in respiratory or joint problems.  In ED, patient with stable vitals and labs.  ID physician, Dr. Linus Salmons, consulted on admission and agreed to see patient on 6/2, but noted that he would like to assess patient before he decides if he would like to proceed with IV antibiotics.  Review Of Systems: Per HPI with the following additions:   Review of Systems  Constitutional: Positive for malaise/fatigue. Negative for fever.  HENT: Negative for congestion.   Respiratory: Positive for cough and shortness of breath.   Cardiovascular: Negative for chest pain.  Gastrointestinal: Negative for abdominal pain, nausea and vomiting.  Musculoskeletal: Positive for joint pain.  Skin: Negative for  rash.  Neurological: Positive for weakness and headaches (migraines).    Patient Active Problem List   Diagnosis Date Noted  . 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 Polyarthritis     Past Surgical History: Past Surgical History:  Procedure Laterality Date  . IR GENERIC HISTORICAL  11/12/2015   IR RADIOLOGIST EVAL & MGMT 11/12/2015 Tami Killings, MD [facility] INTERV RAD    Social History: Social History   Tobacco Use  . Smoking status: Not on file  Substance Use Topics  . Alcohol use: Not on file  . Drug use: Not on file   Additional social history: No alcohol, drug, or tobacco use  Please also refer to relevant sections of EMR.  Family History: History reviewed. No pertinent family history.   Allergies and Medications: No Known Allergies No current facility-administered medications on file prior to encounter.    Current Outpatient Medications on File Prior to Encounter  Medication Sig Dispense Refill  . azithromycin (ZITHROMAX) 500 MG tablet Take 500 mg by mouth daily.    . Cholecalciferol (VITAMIN D-3)  25 MCG (1000 UT) CAPS Take 1,000 Units by mouth daily.    Marland Kitchen ethambutol (MYAMBUTOL) 400 MG tablet Take 400 mg by mouth daily.    Marland Kitchen HYDROcodone-homatropine (HYCODAN) 5-1.5 MG/5ML syrup Take 5 mLs by mouth as needed.    Marland Kitchen ibuprofen (ADVIL) 600 MG tablet Take 600 mg by mouth as needed.    . metoprolol succinate (TOPROL-XL) 50 MG 24 hr tablet Take 50 mg by mouth daily.    Marland Kitchen omeprazole (PRILOSEC) 40 MG capsule Take 40 mg by mouth daily.    . ondansetron (ZOFRAN) 4 MG tablet Take 4 mg by mouth every 6 (six) hours as needed.    . Probiotic Product (PROBIOTIC DAILY PO) Take 1 tablet by mouth daily. Ultra Flora    . rifampin (RIFADIN) 300 MG capsule Take 300 mg by mouth 2 (two) times a day.    . rizatriptan (MAXALT) 10 MG tablet Take 10 mg by mouth as needed for migraine.      Objective: BP 115/72    Pulse (!) 112   Temp 98.9 F (37.2 C) (Oral)   Resp 18   SpO2 98%   Physical Exam Constitutional:      General: She is not in acute distress.    Appearance: Normal appearance.  HENT:     Head: Normocephalic and atraumatic.  Eyes:     Extraocular Movements: Extraocular movements intact.  Cardiovascular:     Rate and Rhythm: Normal rate and regular rhythm.     Heart sounds: No murmur. No friction rub. No gallop.   Pulmonary:     Effort: Pulmonary effort is normal.     Breath sounds: Normal breath sounds. No wheezing, rhonchi or rales.  Abdominal:     General: Abdomen is flat. There is no distension.     Palpations: Abdomen is soft.     Tenderness: There is no abdominal tenderness.  Musculoskeletal:        General: No swelling.     Right lower leg: No edema.     Left lower leg: No edema.     Comments: 5/5 strength BUE, some swelling of bilateral digits, no erythema  Skin:    General: Skin is warm and dry.  Neurological:     Mental Status: She is alert and oriented to person, place, and time.  Psychiatric:        Mood and Affect: Mood normal.      Labs and Imaging: CBC BMET  Recent Labs  Lab 01/14/19 1120  WBC 9.5  HGB 10.7*  HCT 34.5*  PLT 423*   Recent Labs  Lab 01/14/19 1120  NA 136  K 3.9  CL 101  CO2 25  BUN 11  CREATININE 0.46  GLUCOSE 103*  CALCIUM 9.1     Dg Chest 2 View  Result Date: 01/14/2019 CLINICAL DATA:  Sickness.  Mycobacterium avium complex. EXAM: CHEST - 2 VIEW COMPARISON:  None. FINDINGS: There is focal infiltrate in the lingula. Mild increased opacity in the left apex is well. The heart, hila, mediastinum, lungs, and pleura are otherwise unremarkable. IMPRESSION: Infiltrates in the left apex and lingula. Recommend short-term follow-up to ensure resolution. Electronically Signed   By: Dorise Bullion III M.D   On: 01/14/2019 17:43    [place], Bernita Raisin, DO 01/14/2019, 2:40 PM PGY-1, [facility] pager:  [phone], text pages welcome   FPTS Upper-Level Resident Addendum   I have independently interviewed and examined the patient. I have discussed the  above with the original author and agree with their documentation. My edits for correction/addition/clarification are in blue. Please see also any attending notes.   Caroline More, DO PGY-2, [facility] 01/14/2019 6:22 PM  [place] [facility] pager: [phone] (text pages welcome through [place])

## 2019-01-14 NOTE — ED Provider Notes (Signed)
[facility] EMERGENCY DEPARTMENT Provider Note   CSN: [id number] Arrival date & time: 01/14/19  9563    History   Chief Complaint Chief Complaint  Patient presents with  . Joint Pain    HPI Ayriel Gair is a 63 y.o. female.     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  Patient is followed by Dr. Garnette Scheuermann with infectious disease, recently started on rifampin, azithromycin, ethambutol on May 28.  Patient states that she had severe joint pain prior to starting the medications, notes the side effects of any of the medications include joint pain and she is having difficulty opening her pill bottles to take her medication.  Patient reports pain in all of her joints, swelling in her right hand at times, no redness or rashes. 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 Patient does not feel any better with the antibiotics. Patient asked for referral to see cardiothoracic surgery for their opinion. Patient presents to this ER today requesting infectious disease consult and admission for PICC line and antibiotic treatment.       Past Medical History:  Diagnosis Date  . Polyarthritis     There are no active problems to display for this patient.   Past Surgical History:  Procedure Laterality Date  . IR GENERIC HISTORICAL  11/12/2015   IR RADIOLOGIST EVAL & MGMT 11/12/2015 Marybelle Killings, MD [facility] INTERV RAD     OB History   No obstetric history on file.      Home Medications    Prior to Admission medications   Not on File    Family History History reviewed. No pertinent family history.  Social History Social History   Tobacco Use  . Smoking status: Not on file  Substance Use Topics  . Alcohol use: Not on file  . Drug use: Not on file      Allergies   Patient has no known allergies.   Review of Systems Review of Systems  Constitutional: Positive for fatigue. Negative for fever.  Respiratory: Positive for cough and shortness of breath.   Cardiovascular: Negative for chest pain.  Gastrointestinal: Negative for abdominal pain, nausea and vomiting.  Genitourinary: Negative for dysuria and frequency.  Musculoskeletal: Positive for arthralgias and joint swelling.  Skin: Negative for rash and wound.  Allergic/Immunologic: Negative for immunocompromised state.  Neurological: Negative for weakness.  Hematological: Negative for adenopathy. Does not bruise/bleed easily.  All other systems reviewed and are negative.    Physical Exam Updated Vital Signs BP 133/81   Pulse (!) 106   Temp 98.9 F (37.2 C) (Oral)   Resp 18   SpO2 95%   Physical Exam Vitals signs and nursing note reviewed.  Constitutional:      Appearance: Normal appearance.  HENT:     Head: Normocephalic and atraumatic.  Cardiovascular:     Rate and Rhythm: Normal rate and regular rhythm.     Pulses: Normal pulses.     Heart sounds: Normal heart sounds.  Pulmonary:     Effort: Pulmonary effort is normal.     Breath sounds: Normal breath sounds.  Musculoskeletal:        General: Swelling and tenderness present. No signs of injury.  Skin:    General: Skin is warm and dry.  Findings: No erythema or rash.  Neurological:     Mental Status: She is alert and oriented to person, place, and time.  Psychiatric:        Mood and Affect: Mood normal.        Behavior: Behavior normal.      ED Treatments / Results  Labs (all labs ordered are listed, but only abnormal results are displayed) Labs Reviewed  COMPREHENSIVE METABOLIC PANEL - Abnormal; Notable for the following components:      Result Value   Glucose, Bld 103 (*)    Albumin 2.7 (*)    AST 12 (*)    Alkaline Phosphatase 143 (*)    All other components within normal limits  CBC  WITH DIFFERENTIAL/PLATELET - Abnormal; Notable for the following components:   RBC 3.72 (*)    Hemoglobin 10.7 (*)    HCT 34.5 (*)    Platelets 423 (*)    All other components within normal limits  SARS CORONAVIRUS 2 (HOSPITAL ORDER, [place] [facility])    EKG None  Radiology No results found.  Procedures Procedures (including critical care time)  Medications Ordered in ED Medications - No data to display   Initial Impression / Assessment and Plan / ED Course  I have reviewed the triage vital signs and the nursing notes.  Pertinent labs & imaging results that were available during my care of the patient were reviewed by me and considered in my medical decision making (see chart for details).  Clinical Course as of Jan 13 1250  Mon Jan 14, 2019  1248 62yo female requests admission for PICC for abx for treatment of MAC. Review of records in careeverywhere- patient has been seen by Dr. Garnette Scheuermann with [facility], recommends PICC abx however patient was hesitant to start and later called back for PO abx which she started on 01/10/2019. Patient spoke with a nurse from this hospital how has talked to ID and recommended patient come for admission and treatment at this hospital.  CBC and CMP obtained for treatment planning. Discussed with hospitalist with teaching service who will consult for admission.    [LM]    Clinical Course User Index [LM] Tacy Learn, PA-C      Final Clinical Impressions(s) / ED Diagnoses   Final diagnoses:  None    ED Discharge Orders    None       Roque Lias 01/14/19 1252    Charlesetta Shanks, MD 01/15/19 [phone]

## 2019-01-14 NOTE — ED Notes (Signed)
ED TO INPATIENT HANDOFF REPORT  ED Nurse Name and Phone #: Eugene Garnet [phone]  S Name/Age/Gender Tami Barnes 63 y.o. female Room/Bed: 058C/058C  Code Status   Code Status: Not on file  Home/SNF/Other Home Patient oriented to: self, place, time and situation Is this baseline? Yes   Triage Complete: Triage complete  Chief Complaint joint pain  Triage Note Pt in c/o continued joint pain and swelling, was admitted in [facility] for several weeks, was recommended that pt come to [facility] to see infectious disease and receive a PICC for IV antibiotics, no distress noted   Allergies No Known Allergies  Level of Care/Admitting Diagnosis ED Disposition    ED Disposition Condition [place]: [place] [100100]  Level of Care: Med-Surg [16]  Covid Evaluation: N/A  Diagnosis: Mycobacterium avium complex [facility]) [809983]  Admitting Physician: Cleophas Dunker [[id number]]  Attending Physician: Mingo Amber, JEFFREY H [[id number]]  PT Class (Do Not Modify): Observation [104]  PT Acc Code (Do Not Modify): Observation [10022]       B Medical/Surgery History Past Medical History:  Diagnosis Date  . Polyarthritis    Past Surgical History:  Procedure Laterality Date  . IR GENERIC HISTORICAL  11/12/2015   IR RADIOLOGIST EVAL & MGMT 11/12/2015 Marybelle Killings, MD [facility] INTERV RAD     A IV Location/Drains/Wounds Patient Lines/Drains/Airways Status   Active Line/Drains/Airways    Name:   Placement date:   Placement time:   Site:   Days:   Peripheral IV 01/14/19 Left Antecubital   01/14/19    1306    Antecubital   less than 1          Intake/Output Last 24 hours No intake or output data in the 24 hours ending 01/14/19 1615  Labs/Imaging Results for orders placed or performed during the hospital encounter of 01/14/19 (from the past 48 hour(s))  Comprehensive metabolic panel     Status: Abnormal   Collection Time: 01/14/19 11:20 AM  Result Value Ref  Range   Sodium 136 135 - 145 mmol/L   Potassium 3.9 3.5 - 5.1 mmol/L   Chloride 101 98 - 111 mmol/L   CO2 25 22 - 32 mmol/L   Glucose, Bld 103 (H) 70 - 99 mg/dL   BUN 11 8 - 23 mg/dL   Creatinine, Ser 0.46 0.44 - 1.00 mg/dL   Calcium 9.1 8.9 - 10.3 mg/dL   Total Protein 6.7 6.5 - 8.1 g/dL   Albumin 2.7 (L) 3.5 - 5.0 g/dL   AST 12 (L) 15 - 41 U/L   ALT 12 0 - 44 U/L   Alkaline Phosphatase 143 (H) 38 - 126 U/L   Total Bilirubin 0.8 0.3 - 1.2 mg/dL   GFR calc non Af Amer >60 >60 mL/min   GFR calc Af Amer >60 >60 mL/min   Anion gap 10 5 - 15    Comment: Performed at [facility], [address], [place], [place] [address]  CBC with Differential     Status: Abnormal   Collection Time: 01/14/19 11:20 AM  Result Value Ref Range   WBC 9.5 4.0 - 10.5 K/uL   RBC 3.72 (L) 3.87 - 5.11 MIL/uL   Hemoglobin 10.7 (L) 12.0 - 15.0 g/dL   HCT 34.5 (L) 36.0 - 46.0 %   MCV 92.7 80.0 - 100.0 fL   MCH 28.8 26.0 - 34.0 pg   MCHC 31.0 30.0 - 36.0 g/dL   RDW 12.3  11.5 - 15.5 %   Platelets 423 (H) 150 - 400 K/uL   nRBC 0.0 0.0 - 0.2 %   Neutrophils Relative % 82 %   Neutro Abs 7.7 1.7 - 7.7 K/uL   Lymphocytes Relative 9 %   Lymphs Abs 0.9 0.7 - 4.0 K/uL   Monocytes Relative 8 %   Monocytes Absolute 0.7 0.1 - 1.0 K/uL   Eosinophils Relative 1 %   Eosinophils Absolute 0.1 0.0 - 0.5 K/uL   Basophils Relative 0 %   Basophils Absolute 0.0 0.0 - 0.1 K/uL   Immature Granulocytes 0 %   Abs Immature Granulocytes 0.04 0.00 - 0.07 K/uL    Comment: Performed at [facility] [address] [place], [place] [address]  SARS Coronavirus 2 (CEPHEID - Performed in [place] hospital lab), Hosp Order     Status: None   Collection Time: 01/14/19 12:53 PM  Result Value Ref Range   SARS Coronavirus 2 NEGATIVE NEGATIVE    Comment: (NOTE) If result is NEGATIVE SARS-CoV-2 target nucleic acids are NOT DETECTED. The SARS-CoV-2 RNA is generally detectable in upper and lower  respiratory specimens  during the acute phase of infection. The lowest  concentration of SARS-CoV-2 viral copies this assay can detect is 250  copies / mL. A negative result does not preclude SARS-CoV-2 infection  and should not be used as the sole basis for treatment or other  patient management decisions.  A negative result may occur with  improper specimen collection / handling, submission of specimen other  than nasopharyngeal swab, presence of viral mutation(s) within the  areas targeted by this assay, and inadequate number of viral copies  (<250 copies / mL). A negative result must be combined with clinical  observations, patient history, and epidemiological information. If result is POSITIVE SARS-CoV-2 target nucleic acids are DETECTED. The SARS-CoV-2 RNA is generally detectable in upper and lower  respiratory specimens dur ing the acute phase of infection.  Positive  results are indicative of active infection with SARS-CoV-2.  Clinical  correlation with patient history and other diagnostic information is  necessary to determine patient infection status.  Positive results do  not rule out bacterial infection or co-infection with other viruses. If result is PRESUMPTIVE POSTIVE SARS-CoV-2 nucleic acids MAY BE PRESENT.   A presumptive positive result was obtained on the submitted specimen  and confirmed on repeat testing.  While 2019 novel coronavirus  (SARS-CoV-2) nucleic acids may be present in the submitted sample  additional confirmatory testing may be necessary for epidemiological  and / or clinical management purposes  to differentiate between  SARS-CoV-2 and other Sarbecovirus currently known to infect humans.  If clinically indicated additional testing with an alternate test  methodology [phone]) is advised. The SARS-CoV-2 RNA is generally  detectable in upper and lower respiratory sp ecimens during the acute  phase of infection. The expected result is Negative. Fact Sheet for Patients:   StrictlyIdeas.no Fact Sheet for Healthcare Providers: BankingDealers.co.za This test is not yet approved or cleared by the [place] FDA and has been authorized for detection and/or diagnosis of SARS-CoV-2 by FDA under an Emergency Use Authorization (EUA).  This EUA will remain in effect (meaning this test can be used) for the duration of the COVID-19 declaration under Section 564(b)(1) of the Act, 21 U.S.C. section 360bbb-3(b)(1), unless the authorization is terminated or revoked sooner. Performed at [facility], [place] [address] [place], [place] [address]    No results found.  Pending Labs [employer] (From admission, onward)    Start     Ordered   Signed and Held  HIV antibody (Routine Testing)  Once,   R     Signed and Held   Signed and Held  Comprehensive metabolic panel  Tomorrow morning,   R     Signed and Held   Signed and Held  CBC  Tomorrow morning,   R     Signed and Held          Vitals/Pain Today's Vitals   01/14/19 1230 01/14/19 1245 01/14/19 1300 01/14/19 1413  BP: 127/77 133/81 115/72   Pulse: 100 (!) 106 (!) 112   Resp:  18    Temp:      TempSrc:      SpO2: 96% 95% 98%   PainSc:    9     Isolation Precautions No active isolations  Medications Medications  ketorolac (TORADOL) 15 MG/ML injection 15 mg (15 mg Intravenous Given 01/14/19 1408)    Mobility walks Low fall risk   Focused Assessments Pulmonary Assessment Handoff:  Lung sounds:   O2 Device: Room Air        R Recommendations: See Admitting Provider Note  Report given to: Ren  Additional Notes:

## 2019-01-15 DIAGNOSIS — M2550 Pain in unspecified joint: Secondary | ICD-10-CM | POA: Diagnosis not present

## 2019-01-15 DIAGNOSIS — Z87891 Personal history of nicotine dependence: Secondary | ICD-10-CM

## 2019-01-15 DIAGNOSIS — A310 Pulmonary mycobacterial infection: Secondary | ICD-10-CM | POA: Diagnosis not present

## 2019-01-15 DIAGNOSIS — Z8701 Personal history of pneumonia (recurrent): Secondary | ICD-10-CM

## 2019-01-15 DIAGNOSIS — M791 Myalgia, unspecified site: Secondary | ICD-10-CM | POA: Diagnosis not present

## 2019-01-15 LAB — COMPREHENSIVE METABOLIC PANEL
ALT: 12 U/L (ref 0–44)
AST: 16 U/L (ref 15–41)
Albumin: 2.4 g/dL — ABNORMAL LOW (ref 3.5–5.0)
Alkaline Phosphatase: 122 U/L (ref 38–126)
Anion gap: 11 (ref 5–15)
BUN: 15 mg/dL (ref 8–23)
CO2: 24 mmol/L (ref 22–32)
Calcium: 8.7 mg/dL — ABNORMAL LOW (ref 8.9–10.3)
Chloride: 101 mmol/L (ref 98–111)
Creatinine, Ser: 0.45 mg/dL (ref 0.44–1.00)
GFR calc Af Amer: >60 mL/min (ref >60)
GFR calc non Af Amer: >60 mL/min (ref >60)
Glucose, Bld: 92 mg/dL (ref 70–99)
Potassium: 4 mmol/L (ref 3.5–5.1)
Sodium: 136 mmol/L (ref 135–145)
Total Bilirubin: 0.8 mg/dL (ref 0.3–1.2)
Total Protein: 5.9 g/dL — ABNORMAL LOW (ref 6.5–8.1)

## 2019-01-15 LAB — CBC
HCT: 31 % — ABNORMAL LOW (ref 36.0–46.0)
Hemoglobin: 10 g/dL — ABNORMAL LOW (ref 12.0–15.0)
MCH: 29.3 pg (ref 26.0–34.0)
MCHC: 32.3 g/dL (ref 30.0–36.0)
MCV: 90.9 fL (ref 80.0–100.0)
Platelets: 374 10*3/uL (ref 150–400)
RBC: 3.41 MIL/uL — ABNORMAL LOW (ref 3.87–5.11)
RDW: 12.4 % (ref 11.5–15.5)
WBC: 9.3 10*3/uL (ref 4.0–10.5)
nRBC: 0 % (ref 0.0–0.2)

## 2019-01-15 LAB — HIV ANTIBODY (ROUTINE TESTING W REFLEX): HIV Screen 4th Generation wRfx: NONREACTIVE

## 2019-01-15 MED ORDER — ETHAMBUTOL HCL 400 MG PO TABS: 1200.0000 mg | ORAL_TABLET | Freq: Every day | ORAL | 0 refills | Status: AC

## 2019-01-15 MED ORDER — KETOROLAC TROMETHAMINE 10 MG PO TABS: 10.0000 mg | ORAL_TABLET | Freq: Four times a day (QID) | ORAL | 0 refills | Status: AC | PRN

## 2019-01-15 MED ORDER — DICLOFENAC SODIUM 1 % TD GEL: 2.0000 g | Freq: Four times a day (QID) | TRANSDERMAL | 0 refills | Status: AC

## 2019-01-15 MED ORDER — ETHAMBUTOL HCL 400 MG PO TABS: 15.0000 mg/kg | ORAL_TABLET | Freq: Every day | ORAL | Status: DC

## 2019-01-15 NOTE — Progress Notes (Addendum)
DISCHARGE NOTE SNF [facility] to be discharged Home per MD order. Patient verbalized understanding.  Skin clean, dry and intact without evidence of skin break down, no evidence of skin tears noted. IV catheter discontinued intact. Site without signs and symptoms of complications. Dressing and pressure applied. Pt denies pain at the site currently. Requested to see [facility] resident MD , notified and aware. No complaints noted.  Patient free of lines, drains, and wounds.   Discharge packet assembled. An After Visit Summary (AVS) was printed and given to the patient at bedside. Patient escorted via wheelchair and discharged  via private vehicle. All questions and concerns addressed.   Katrina Stack, RN

## 2019-01-15 NOTE — Consult Note (Signed)
[facility] for Infectious Disease       Reason for Consult: MAI infection    Referring Physician: FM team  Active Problems:   Mycobacterium avium complex ([place])   Polyarthralgia   Hypertension   Cough   Gastroesophageal reflux disease    acidophilus   Oral Daily   azithromycin  500 mg Oral Daily   cholecalciferol  1,000 Units Oral Daily   diclofenac sodium  2 g Topical QID   enoxaparin (LOVENOX) injection  40 mg Subcutaneous Q24H   ethambutol  400 mg Oral Daily   metoprolol succinate  50 mg Oral Daily   pantoprazole  40 mg Oral Daily   ramelteon  8 mg Oral QHS   rifampin  300 mg Oral BID    Recommendations: Continue azithromycin, ethambutol (new dose of 1200 mg daily instead of 800 mg), rifampin zofran as needed for nausea  She is requesting something for her joint pain until seen by rheumatology   Assessment: She has pulmonary cavitary MAI infection.  She has started on appropriate 3 drug regimen as above and can consider IV therapy once sensitivities are known with amikacin or streptomycin three times per week.  She prefers an infusion center/short stay instead of home health.   Side effects discussed with her by Jimmy Footman, PharmD She understands there is an approximate 60-80% chance of significant improvement over time and that it will take months to see any improvement.  She is scheduled with me on June 18th and knows to call to cancel if she is unable to make the appointment (she is out of network with [facility] and may have cost issues)  Antibiotics: Ethambutol, azithromycin and rifampin  HPI: Tami Barnes is a 63 y.o. female previous smoker with some episodes of pneumonia in the past and recent non-resolving pneumonia found to have cavitary/nodular lesions on bilateral lobes and recent BAL at [facility] with MAI positive after 2 weeks.  Sensitivities have been sent out and pending.  Results of BAL not available on Care Everywhere.  She was advised by  Dr. Garnette Scheuermann at [facility] to also start amikacin but she was hesitant.  She additionally has had intermittent joint swelling and pain of mainly small joints that are new over the last 2 months.  Never had anything like this before.  She has noted some improvement with Toradol here, ibuprofen otherwise has not helped much at home.  She has very little cough, some SOB, not very productive.  She was also told by her pulmonologist that a lot of this may be reflux related.  She was to get a referral to Dr. Roxan Hockey but with bilateral findings, would not be a likely candidate for much.  She though does have an appointment with him June 15th set up by Dr. Garnette Scheuermann.   Review of Systems:  Constitutional: positive for fatigue, malaise and weight loss or negative for fevers and chills Respiratory: positive for cough or dyspnea on exertion, negative for sputum or hemoptysis Gastrointestinal: negative for diarrhea Integument/breast: negative for rash Hematologic/lymphatic: negative for lymphadenopathy Musculoskeletal: positive for myalgias, arthralgias and stiff joints All other systems reviewed and are negative    Past Medical History:  Diagnosis Date   Polyarthritis   MAI infection  Social History   Tobacco Use   Smoking status: Not on file  Substance Use Topics   Alcohol use: Not on file   Drug use: Not on file    [facility]: no pulmonary disease  No Known  Allergies  Physical Exam: Constitutional: in no apparent distress  Vitals:   01/15/19 0700 01/15/19 0947  BP: 118/80 (!) 133/94  Pulse: (!) 107 (!) 116  Resp: 19 (!) 22  Temp: 98.2 F (36.8 C) 98.2 F (36.8 C)  SpO2: 98% 95%   EYES: anicteric ENMT: no thrush Cardiovascular: Cor RRR Respiratory: CTA B; normal respiratory effort GI: Bowel sounds are normal, liver is not enlarged, spleen is not enlarged Musculoskeletal: no pedal edema noted Skin: negatives: no rash Hematologic: no cervical lad Neuro: non-focal  Lab Results    Component Value Date   WBC 9.3 01/15/2019   HGB 10.0 (L) 01/15/2019   HCT 31.0 (L) 01/15/2019   MCV 90.9 01/15/2019   PLT 374 01/15/2019    Lab Results  Component Value Date   CREATININE 0.45 01/15/2019   BUN 15 01/15/2019   NA 136 01/15/2019   K 4.0 01/15/2019   CL 101 01/15/2019   CO2 24 01/15/2019    Lab Results  Component Value Date   ALT 12 01/15/2019   AST 16 01/15/2019   ALKPHOS 122 01/15/2019     Microbiology: Recent Results (from the past 240 hour(s))  SARS Coronavirus 2 (CEPHEID - Performed in [facility] [place] hospital lab), Hosp Order     Status: None   Collection Time: 01/14/19 12:53 PM  Result Value Ref Range Status   SARS Coronavirus 2 NEGATIVE NEGATIVE Final    Comment: (NOTE) If result is NEGATIVE SARS-CoV-2 target nucleic acids are NOT DETECTED. The SARS-CoV-2 RNA is generally detectable in upper and lower  respiratory specimens during the acute phase of infection. The lowest  concentration of SARS-CoV-2 viral copies this assay can detect is 250  copies / mL. A negative result does not preclude SARS-CoV-2 infection  and should not be used as the sole basis for treatment or other  patient management decisions.  A negative result may occur with  improper specimen collection / handling, submission of specimen other  than nasopharyngeal swab, presence of viral mutation(s) within the  areas targeted by this assay, and inadequate number of viral copies  (<250 copies / mL). A negative result must be combined with clinical  observations, patient history, and epidemiological information. If result is POSITIVE SARS-CoV-2 target nucleic acids are DETECTED. The SARS-CoV-2 RNA is generally detectable in upper and lower  respiratory specimens dur ing the acute phase of infection.  Positive  results are indicative of active infection with SARS-CoV-2.  Clinical  correlation with patient history and other diagnostic information is  necessary to determine patient  infection status.  Positive results do  not rule out bacterial infection or co-infection with other viruses. If result is PRESUMPTIVE POSTIVE SARS-CoV-2 nucleic acids MAY BE PRESENT.   A presumptive positive result was obtained on the submitted specimen  and confirmed on repeat testing.  While 2019 novel coronavirus  (SARS-CoV-2) nucleic acids may be present in the submitted sample  additional confirmatory testing may be necessary for epidemiological  and / or clinical management purposes  to differentiate between  SARS-CoV-2 and other Sarbecovirus currently known to infect humans.  If clinically indicated additional testing with an alternate test  methodology [phone]) is advised. The SARS-CoV-2 RNA is generally  detectable in upper and lower respiratory sp ecimens during the acute  phase of infection. The expected result is Negative. Fact Sheet for Patients:  StrictlyIdeas.no Fact Sheet for Healthcare Providers: BankingDealers.co.za This test is not yet approved or cleared by the [place] FDA and has  been authorized for detection and/or diagnosis of SARS-CoV-2 by FDA under an Emergency Use Authorization (EUA).  This EUA will remain in effect (meaning this test can be used) for the duration of the COVID-19 declaration under Section 564(b)(1) of the Act, 21 U.S.C. section 360bbb-3(b)(1), unless the authorization is terminated or revoked sooner. Performed at [facility], [place] [address], [place], [place] [address]     Azam Gervasi W Colena Ketterman, [place] for Infectious Disease [facility] [email or web address] 01/15/2019, 11:24 AM

## 2019-01-15 NOTE — Discharge Summary (Signed)
[facility] Discharge Summary  Patient name: Tami Barnes Medical record number: [id number] Date of birth: 12-18-1955 Age: 63 y.o. Gender: female Date of Admission: 01/14/2019  Date of Discharge: 01/15/2019 Admitting Physician: Alveda Reasons, MD  Primary Care Provider: Loraine Leriche., MD Consultants: Infectious disease  Indication for Hospitalization: Worsened arthralgias  Discharge Diagnoses/Problem List:  MAC pneumonia Arthralgia  Disposition: Discharge home  Discharge Condition: Stable  Discharge Exam:  General: Alert and cooperative and appears to be in no acute distress.  Walked from the bathroom to the sink with mild difficulty. Cardio: Normal S1 and S2, no S3 or S4. Rhythm is regular. No murmurs or rubs.   Pulm: Clear to auscultation bilaterally, no crackles, wheezing, or diminished breath sounds. Normal respiratory effort Abdomen: Bowel sounds normal. Abdomen soft and non-tender.  Extremities: No peripheral edema. Warm/ well perfused.  Strong radial pulse. Neuro: Cranial nerves grossly intact  Brief Hospital Course:  Tami Bouldinis a 63 y.o.femalewho presented with polyarticular joint pain and MAC pneumonia. PMH is significant forknown MAC pneumonia, HTN, joint pain with positive RF and ANCA, migraines, GERD.  She had previously been diagnosed with MAC pneumonia during her recent hospitalization.  At that previous hospitalization, she refused placement of a PICC line in favor of oral antibiotics.  After returning home, she found that her arthralgias worsened and came to the hospital to be assessed by infectious disease to see if a treatment via PICC line would be better for her at this time and improve her symptoms.  MAC pneumonia She was admitted and monitored overnight in order to be assessed by ID on 6/2.  ID recommended increasing her dose of ethambutol but continuing with oral treatment.  She was discharged with his new antibiotic  regimen with follow-up with infectious disease.  Arthralgias She has been experiencing significant global arthralgia for several weeks.  She is concerned this may be related to her MAC pneumonia.  She has previously been found to have rheumatoid factor and is ANCA positive.  She has been referred to rheumatology previously.  She was encouraged to continue outpatient visits with rheumatology for work-up and treatment.  Issues for Follow Up:  1. Follow-up with ID for appropriate treatment for MAC pneumonia. 2. Follow-up with rheumatology for appropriate work-up and treatment of her arthralgias. 3. She noted significant improvement with p.o. Toradol during her hospitalization and was discharged with a month supply of p.o. Toradol.  Significant Procedures: None  Significant Labs and Imaging:  Recent Labs  Lab 01/14/19 1120 01/15/19 0342  WBC 9.5 9.3  HGB 10.7* 10.0*  HCT 34.5* 31.0*  PLT 423* 374   Recent Labs  Lab 01/14/19 1120 01/15/19 0342  NA 136 136  K 3.9 4.0  CL 101 101  CO2 25 24  GLUCOSE 103* 92  BUN 11 15  CREATININE 0.46 0.45  CALCIUM 9.1 8.7*  ALKPHOS 143* 122  AST 12* 16  ALT 12 12  ALBUMIN 2.7* 2.4*    Dg Chest 2 View  Result Date: 01/14/2019 CLINICAL DATA:  Sickness.  Mycobacterium avium complex. EXAM: CHEST - 2 VIEW COMPARISON:  None. FINDINGS: There is focal infiltrate in the lingula. Mild increased opacity in the left apex is well. The heart, hila, mediastinum, lungs, and pleura are otherwise unremarkable. IMPRESSION: Infiltrates in the left apex and lingula. Recommend short-term follow-up to ensure resolution. Electronically Signed   By: Dorise Bullion III M.D   On: 01/14/2019 17:43     Results/Tests Pending at  Time of Discharge: none  Discharge Medications:  Allergies as of 01/15/2019   No Known Allergies     Medication List    TAKE these medications   azithromycin 500 MG tablet Commonly known as:  ZITHROMAX Take 500 mg by mouth daily.    diclofenac sodium 1 % Gel Commonly known as:  VOLTAREN Apply 2 g topically 4 (four) times daily.   ethambutol 400 MG tablet Commonly known as:  MYAMBUTOL Take 3 tablets (1,200 mg total) by mouth daily. What changed:  how much to take   HYDROcodone-homatropine 5-1.5 MG/5ML syrup Commonly known as:  HYCODAN Take 5 mLs by mouth as needed.   ibuprofen 600 MG tablet Commonly known as:  ADVIL Take 600 mg by mouth as needed.   ketorolac 10 MG tablet Commonly known as:  TORADOL Take 1 tablet (10 mg total) by mouth every 6 (six) hours as needed for severe pain.   metoprolol succinate 50 MG 24 hr tablet Commonly known as:  TOPROL-XL Take 50 mg by mouth daily.   omeprazole 40 MG capsule Commonly known as:  PRILOSEC Take 40 mg by mouth daily.   ondansetron 4 MG tablet Commonly known as:  ZOFRAN Take 4 mg by mouth every 6 (six) hours as needed.   PROBIOTIC DAILY PO Take 1 tablet by mouth daily. Ultra Flora   rifampin 300 MG capsule Commonly known as:  RIFADIN Take 300 mg by mouth 2 (two) times a day.   rizatriptan 10 MG tablet Commonly known as:  MAXALT Take 10 mg by mouth as needed for migraine.   Vitamin D-3 25 MCG (1000 UT) Caps Take 1,000 Units by mouth daily.       Discharge Instructions: Please refer to Patient Instructions section of EMR for full details.  Patient was counseled important signs and symptoms that should prompt return to medical care, changes in medications, dietary instructions, activity restrictions, and follow up appointments.   Follow-Up Appointments:   Matilde Haymaker, MD 01/15/2019, 4:17 PM PGY-1, Media

## 2019-01-15 NOTE — Progress Notes (Signed)
Family Medicine Teaching Service Daily Progress Note Intern Pager: [phone]  Patient name: Tami Barnes Medical record number: [id number] Date of birth: [birth year] Age: 63 y.o. Gender: female  Primary Care Provider: Loraine Leriche., MD Consultants: ID Code Status: Full  Pt Overview and Major Events to Date:  6/1-admitted for MAC pneumonia  Assessment and Plan: Tami Barnes is a 63 y.o. female presenting with polyarticular joint pain and MAC pneumonia. PMH is significant for known MAC pneumonia, HTN, joint pain with positive RF and ANCA, migraines, GERD.  MAC pneumonia Chest x-ray shows infiltrates in the left apex and lingula, no comparison noted.  She has not noted any worsened trouble breathing since admission.  We will follow-up with ID recommendations. -ID following, appreciate recommendations -Consult CVTS pending ID recs -Azithromycin -Ethambutol -Rifampin -No precautions necessary per ID  Arthritis, rheumatoid, ANCA positive She has noted significant improvement with arthralgias since receiving Toradol. - toradol 57m q6h prn - tylenol [id number] prn - voltaren gel QID to affected areas - f/u rheum outpatient  FEN/GI: General diet PPx: Lovenox  Disposition: Pending ID recommendations  Subjective:  She continues to report significant arthralgias though notable improvement since receiving Toradol.  She reports no worsened shortness of breath or respiratory symptoms.  She reports no new symptoms since hospital admission.  Objective: Temp:  [98 F (36.7 C)-98.9 F (37.2 C)] 98.5 F (36.9 C) (06/01 2000) Pulse Rate:  [97-124] 111 (06/01 2000) Resp:  [16-20] 20 (06/01 2000) BP: (112-136)/(35-96) 120/75 (06/01 2000) SpO2:  [95 %-100 %] 96 % (06/01 2000) Weight:  [81 kg] 81 kg (06/01 2000)  Physical Exam: General: Alert and cooperative and appears to be in no acute distress.  Walked from the bathroom to the sink with mild difficulty. Cardio: Normal S1 and S2,  no S3 or S4. Rhythm is regular. No murmurs or rubs.   Pulm: Clear to auscultation bilaterally, no crackles, wheezing, or diminished breath sounds. Normal respiratory effort Abdomen: Bowel sounds normal. Abdomen soft and non-tender.  Extremities: No peripheral edema. Warm/ well perfused.  Strong radial pulse. Neuro: Cranial nerves grossly intact  Laboratory: Recent Labs  Lab 01/14/19 1120 01/15/19 0342  WBC 9.5 9.3  HGB 10.7* 10.0*  HCT 34.5* 31.0*  PLT 423* 374   Recent Labs  Lab 01/14/19 1120 01/15/19 0342  NA 136 136  K 3.9 4.0  CL 101 101  CO2 25 24  BUN 11 15  CREATININE 0.46 0.45  CALCIUM 9.1 8.7*  PROT 6.7 5.9*  BILITOT 0.8 0.8  ALKPHOS 143* 122  ALT 12 12  AST 12* 16  GLUCOSE 103* 92    Imaging/Diagnostic Tests: Dg Chest 2 View  Result Date: 01/14/2019 CLINICAL DATA:  Sickness.  Mycobacterium avium complex. EXAM: CHEST - 2 VIEW COMPARISON:  None. FINDINGS: There is focal infiltrate in the lingula. Mild increased opacity in the left apex is well. The heart, hila, mediastinum, lungs, and pleura are otherwise unremarkable. IMPRESSION: Infiltrates in the left apex and lingula. Recommend short-term follow-up to ensure resolution. Electronically Signed   By: DaDorise BullionII M.D   On: 01/14/2019 17:43     FrMatilde HaymakerMD 01/15/2019, 5:44 AM PGY-1, [place] [facility] pager: [phone] pages welcome

## 2019-01-15 NOTE — Discharge Instructions (Signed)
While you are here, you were seen by the infectious disease doctors.  The recommendation from infectious disease was to increase your ethambutol to 1200 mg/day.  It will be important to follow-up with your infectious disease doctors to make sure that you are taking her medication appropriately getting everything that you need.  Please also follow-up with rheumatology who will be able to help you with your joint pain.

## 2019-01-28 ENCOUNTER — Encounter: Payer: BC Managed Care – PPO | Admitting: Thoracic Surgery (Cardiothoracic Vascular Surgery)

## 2019-01-31 ENCOUNTER — Inpatient Hospital Stay: Payer: BC Managed Care – PPO | Admitting: Internal Medicine

## 2019-10-25 ENCOUNTER — Ambulatory Visit: Payer: BC Managed Care – PPO

## 2020-08-16 IMAGING — CR CHEST - 2 VIEW
2 series · 2 of 2 positions shown · non-contrast
Comparison: None.

CLINICAL DATA: Sickness.  Mycobacterium avium complex.

EXAM:
CHEST - 2 VIEW

[chest pa]
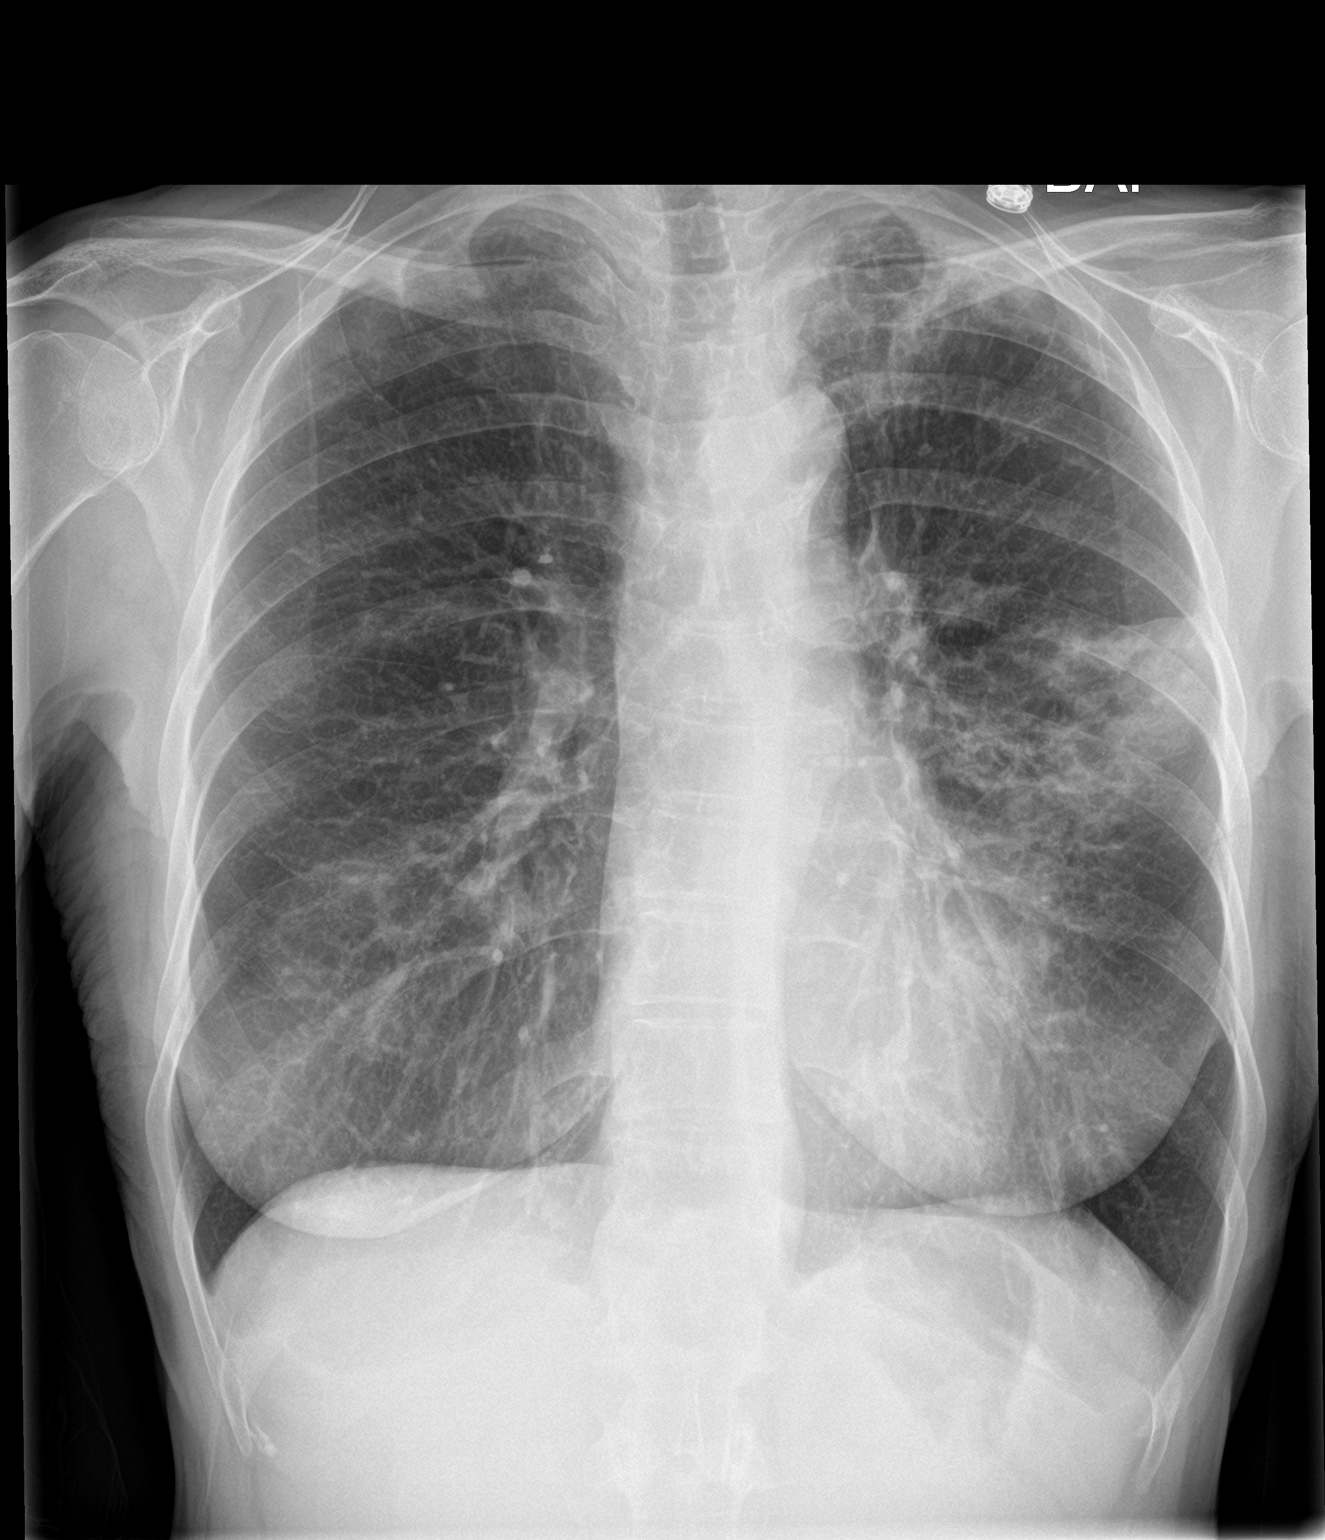

[chest lat]
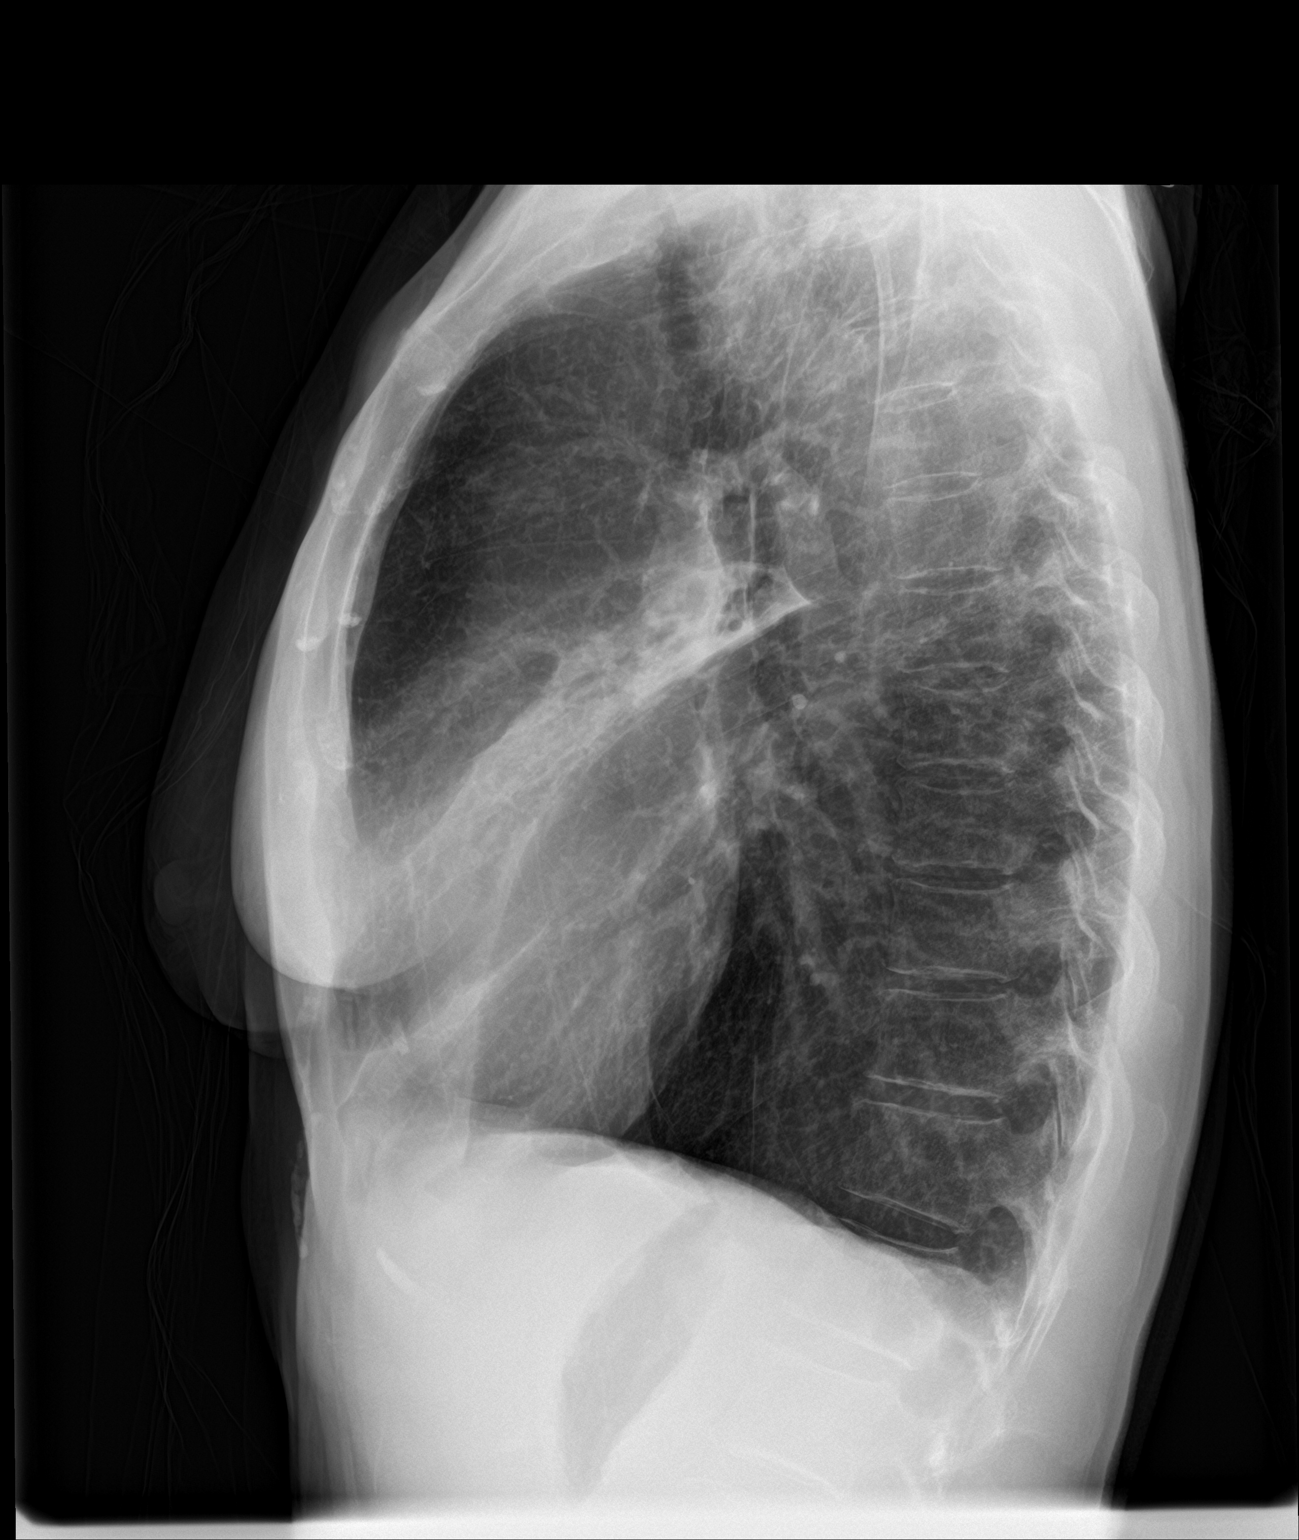

[2 of 2 positions shown; findings below may reference images not displayed]

FINDINGS: There is focal infiltrate in the lingula. Mild increased opacity in
the left apex is well. The heart, hila, mediastinum, lungs, and
pleura are otherwise unremarkable.
IMPRESSION: Infiltrates in the left apex and lingula. Recommend short-term
follow-up to ensure resolution.
